# Patient Record
Sex: Female | Born: 2000 | Race: White | Hispanic: No | Marital: Single | Smoking: Never smoker
Health system: Southern US, Community
[De-identification: ages and names within clinical notes are randomized; demographics above are authoritative.]

## PROBLEM LIST (undated history)

## (undated) DIAGNOSIS — R51 Headache: Secondary | ICD-10-CM

## (undated) DIAGNOSIS — R519 Headache, unspecified: Secondary | ICD-10-CM

## (undated) DIAGNOSIS — E669 Obesity, unspecified: Secondary | ICD-10-CM

## (undated) DIAGNOSIS — T7840XA Allergy, unspecified, initial encounter: Secondary | ICD-10-CM

---
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
## 2018-10-12 IMAGING — CR DG SHOULDER 2+V*L*
3 series · 3 of 3 positions shown · non-contrast
Comparison: None.

CLINICAL DATA: Left shoulder pain following fall, initial encounter

EXAM:
LEFT SHOULDER - 2+ VIEW

[shoulder grashey]
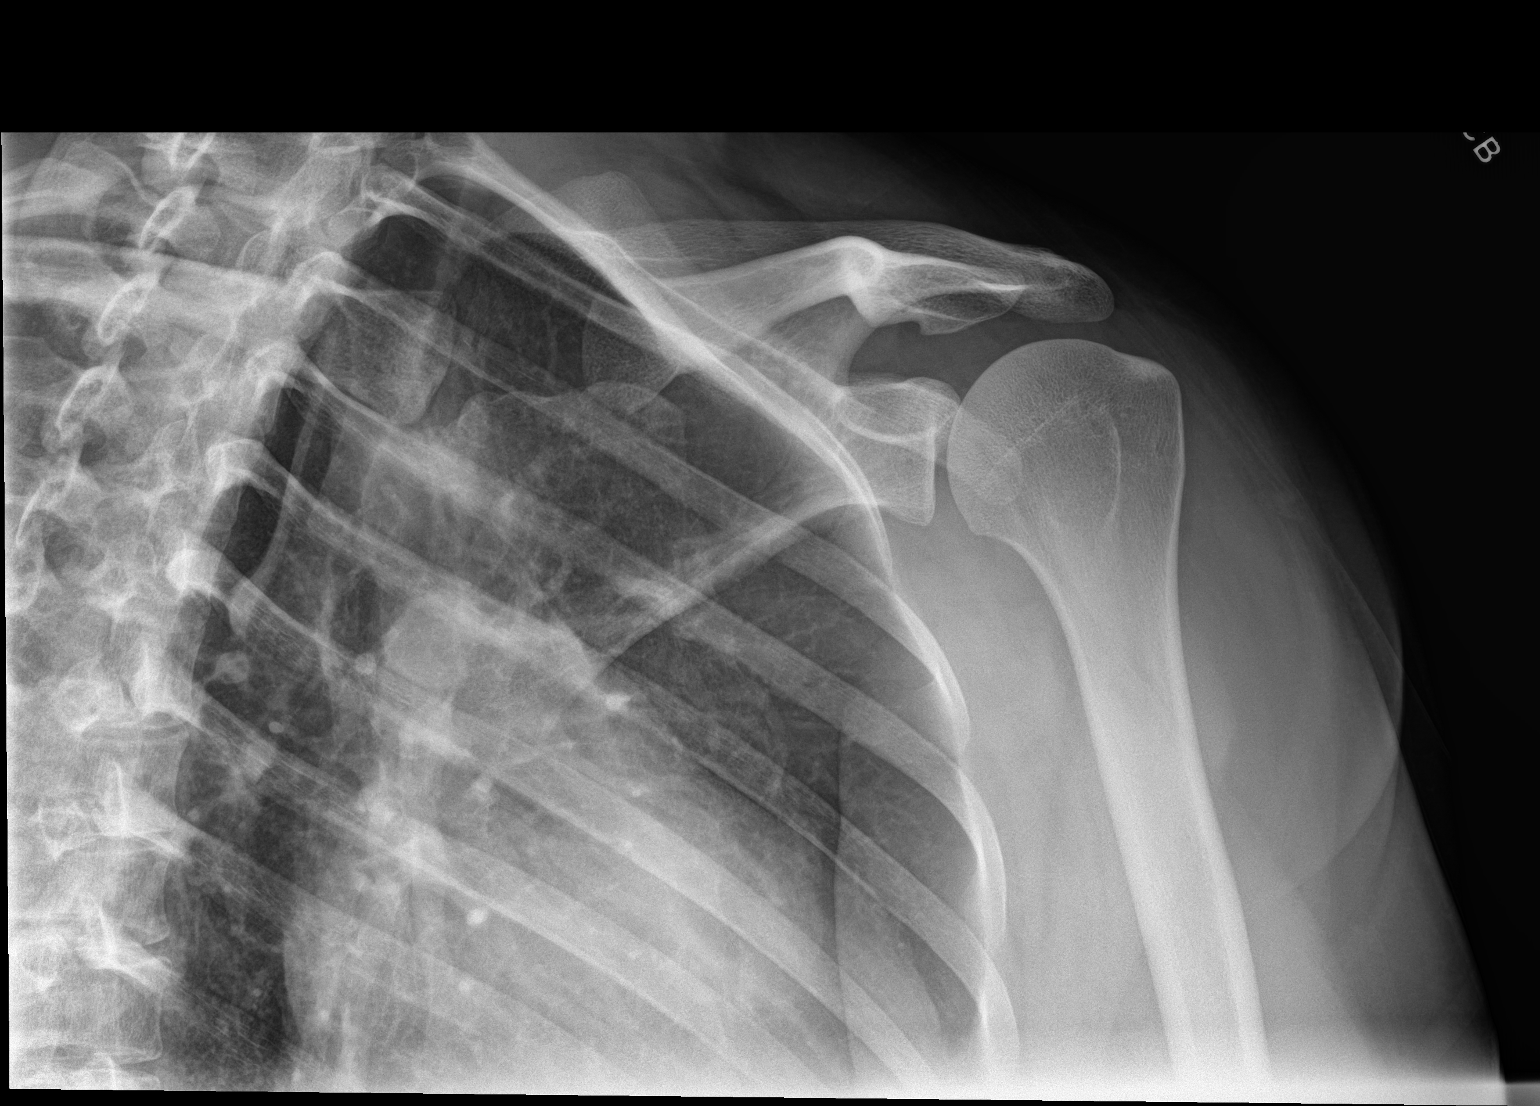

[shoulder y view]
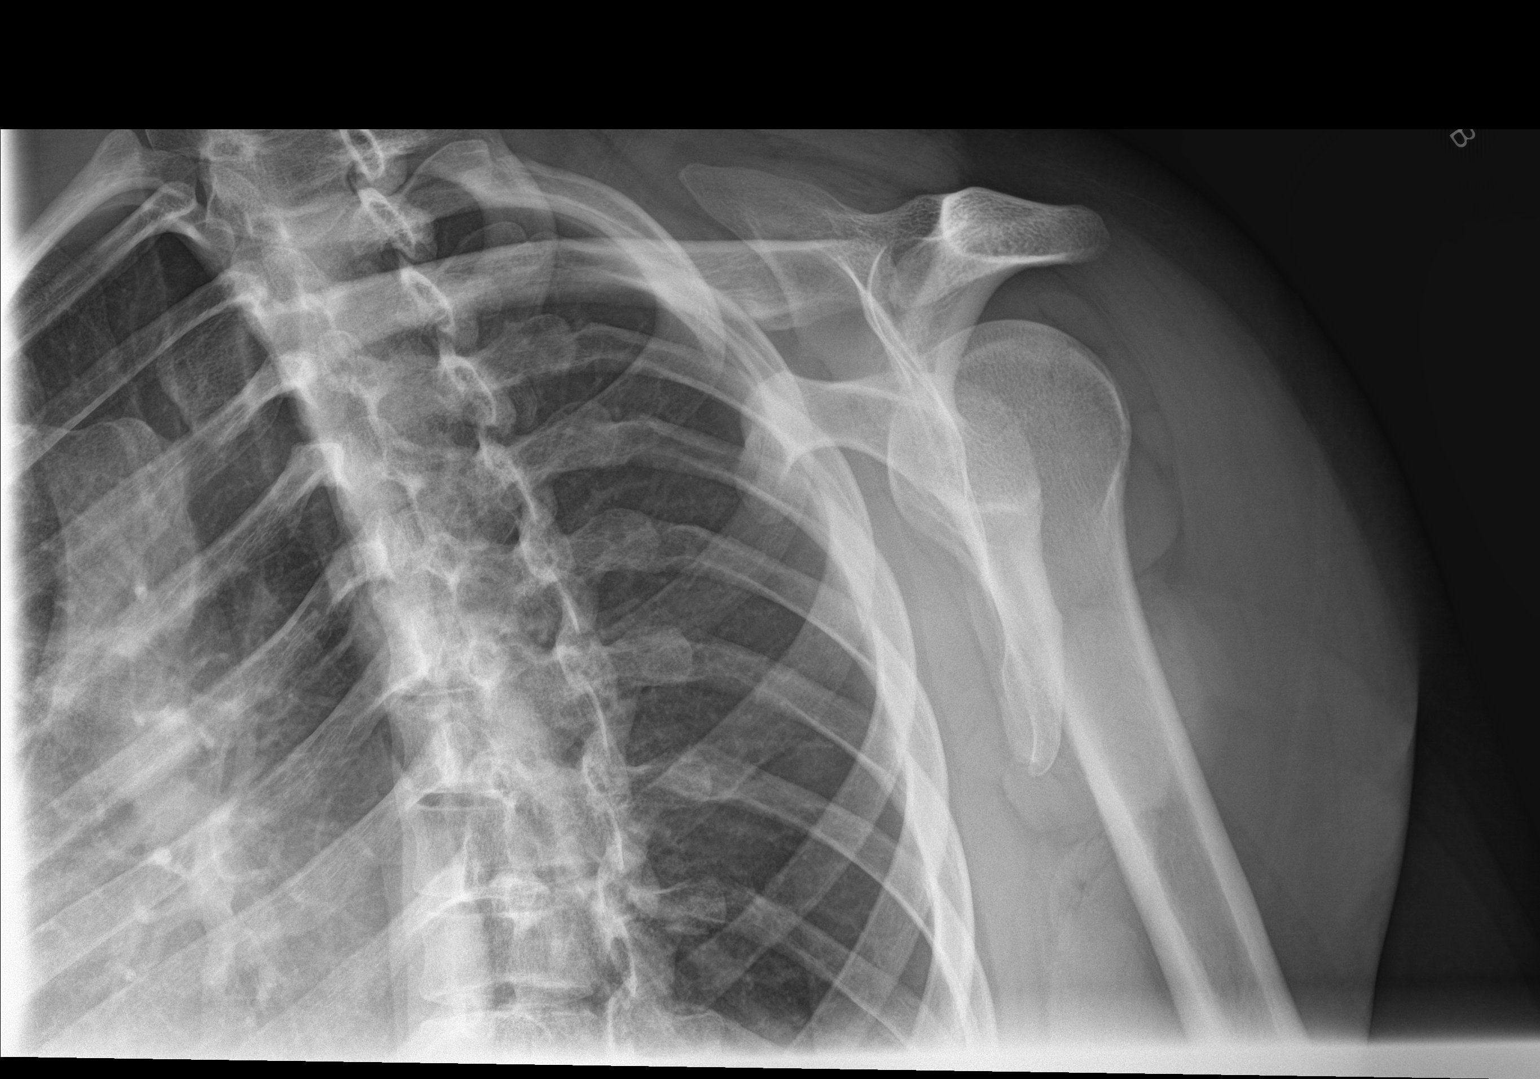

[shoulder axial]
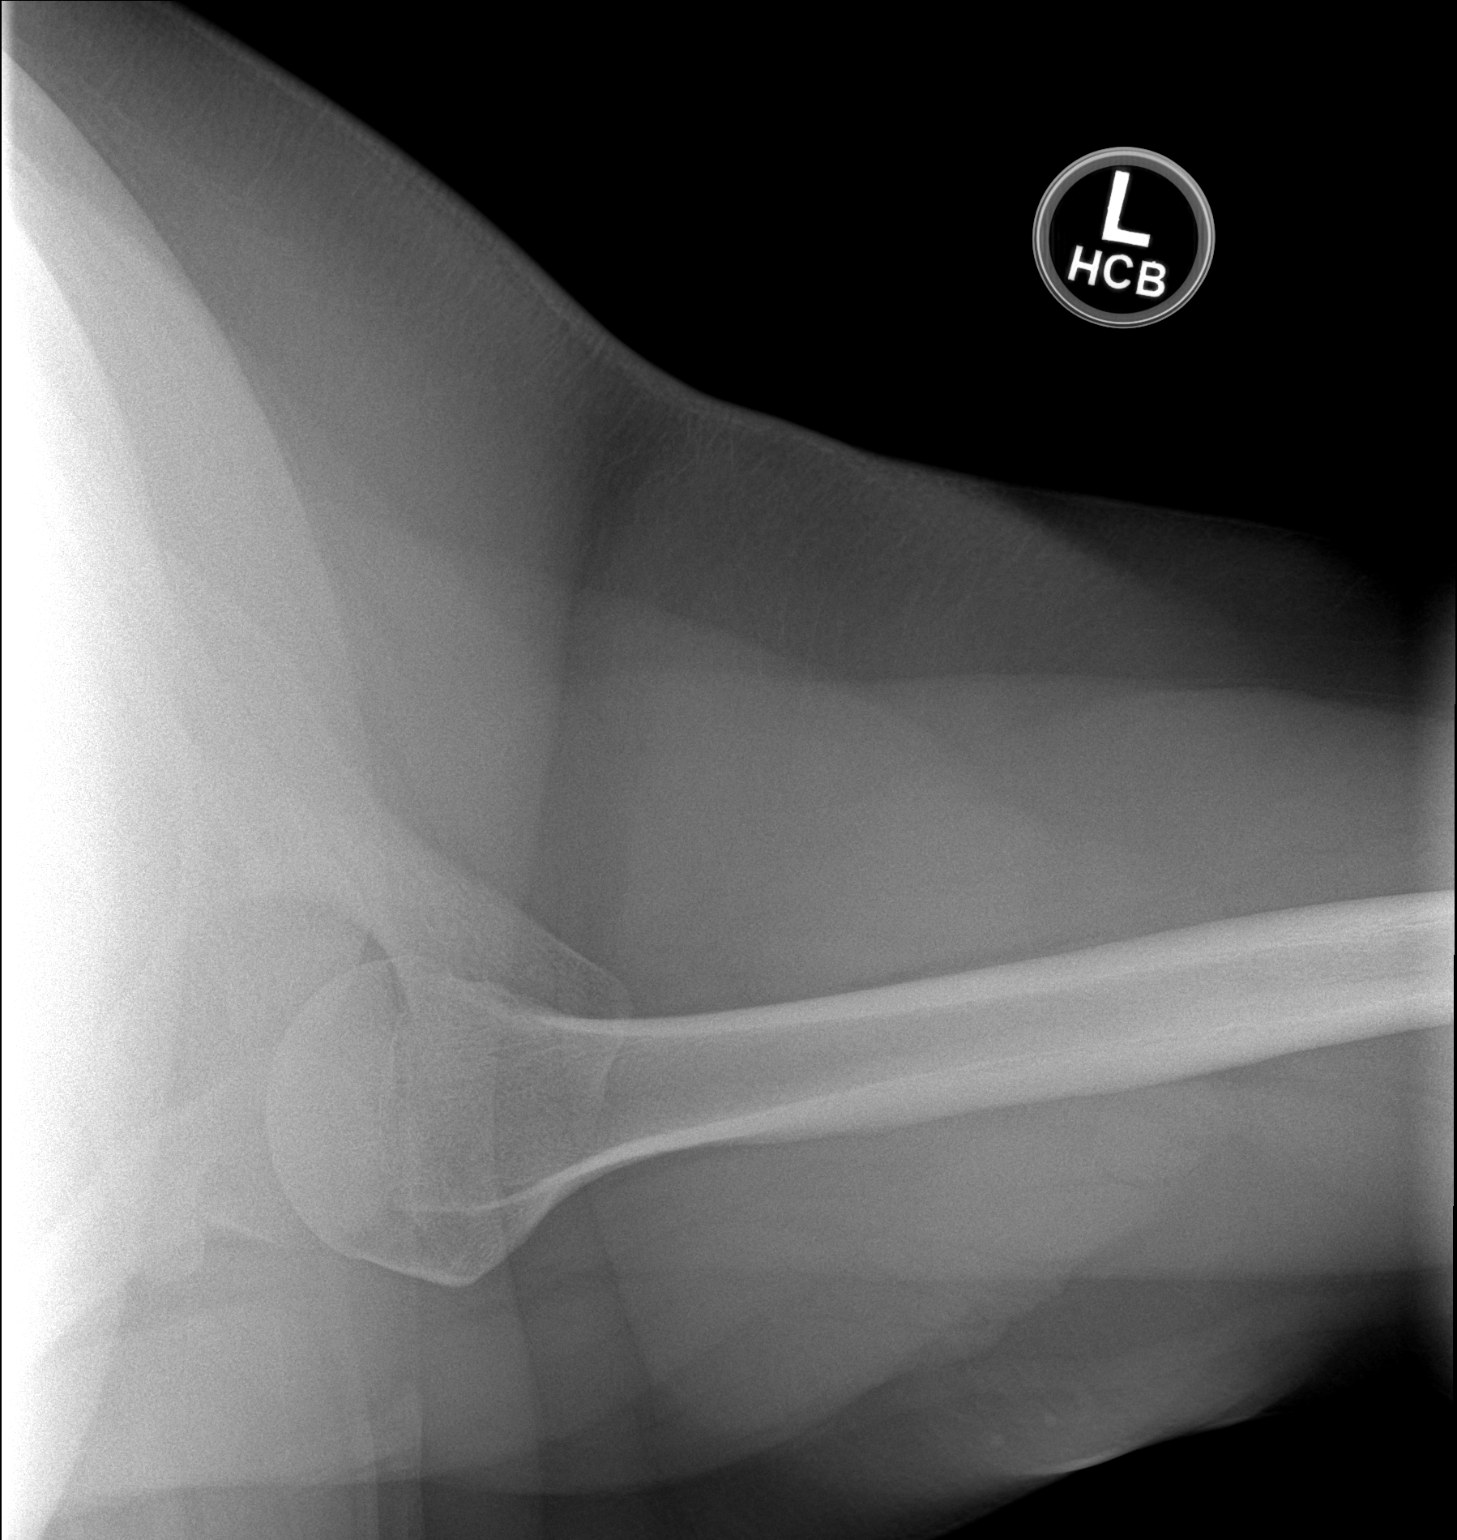

[3 of 3 positions shown; findings below may reference images not displayed]

FINDINGS: There is no evidence of fracture or dislocation. There is no
evidence of arthropathy or other focal bone abnormality. Soft
tissues are unremarkable.
IMPRESSION: No acute abnormality noted.

## 2019-01-18 ENCOUNTER — Other Ambulatory Visit: Payer: Self-pay

## 2019-01-18 ENCOUNTER — Ambulatory Visit
Admission: EM | Admit: 2019-01-18 | Discharge: 2019-01-18 | Disposition: A | Discharge status: Home / Self Care | Payer: Medicaid Other | Attending: Family Medicine | Admitting: Family Medicine

## 2019-01-18 ENCOUNTER — Encounter: Payer: Self-pay | Admitting: Emergency Medicine

## 2019-01-18 DIAGNOSIS — J111 Influenza due to unidentified influenza virus with other respiratory manifestations: Secondary | ICD-10-CM

## 2019-01-18 DIAGNOSIS — R69 Illness, unspecified: Secondary | ICD-10-CM | POA: Insufficient documentation

## 2019-01-18 DIAGNOSIS — J020 Streptococcal pharyngitis: Secondary | ICD-10-CM | POA: Insufficient documentation

## 2019-01-18 DIAGNOSIS — R0981 Nasal congestion: Secondary | ICD-10-CM

## 2019-01-18 LAB — RAPID INFLUENZA A&B ANTIGENS: Influenza A (ARMC): NEGATIVE

## 2019-01-18 LAB — RAPID STREP SCREEN (MED CTR MEBANE ONLY): Streptococcus, Group A Screen (Direct): POSITIVE — AB

## 2019-01-18 LAB — RAPID INFLUENZA A&B ANTIGENS (ARMC ONLY): INFLUENZA B (ARMC): NEGATIVE

## 2019-01-18 MED ORDER — OSELTAMIVIR PHOSPHATE 75 MG PO CAPS: 75.0000 mg | ORAL_CAPSULE | Freq: Two times a day (BID) | ORAL | 0 refills | Status: DC

## 2019-01-18 MED ORDER — PENICILLIN V POTASSIUM 500 MG PO TABS: 500.0000 mg | ORAL_TABLET | Freq: Three times a day (TID) | ORAL | 0 refills | Status: DC

## 2019-01-18 NOTE — ED Provider Notes (Signed)
[facility]    CSN: [id number] Arrival date & time: 01/18/19  0804     History   Chief Complaint Chief Complaint  Patient presents with  . Sore Throat  . Fever  . Generalized Body Aches    HPI Amy Conley is a 18 y.o. female.   The history is provided by the patient.  Sore Throat   Fever  Associated symptoms: congestion   URI  Presenting symptoms: congestion and fever   Severity:  Moderate Onset quality:  Sudden Duration:  2 days Timing:  Constant Progression:  Worsening Chronicity:  New Relieved by:  None tried Ineffective treatments:  None tried Associated symptoms: no wheezing   Risk factors: sick contacts (flu)     Past Medical History:  Diagnosis Date  . Allergy    SEASONAL ALLERGIES-USES ALBUTEROL INHALER PRN  . Headache    MIGRAINES-TAKES PHENERGAN AND ZOFRAN  . Obesity     There are no active problems to display for this patient.   Past Surgical History:  Procedure Laterality Date  . TONSILLECTOMY AND ADENOIDECTOMY Bilateral 10/03/2016   Procedure: TONSILLECTOMY AND ADENOIDECTOMY;  Surgeon: Vernie Murders, MD;  Location: [facility] ORS;  Service: ENT;  Laterality: Bilateral;    OB History   No obstetric history on file.      Home Medications    Prior to Admission medications   Medication Sig Start Date End Date Taking? Authorizing Provider  etonogestrel (NEXPLANON) 68 MG IMPL implant 1 each by Subdermal route once.   Yes [provider]  norethindrone-ethinyl estradiol-iron (ESTROSTEP FE,TILIA FE,TRI-LEGEST FE) 1-20/1-30/1-35 MG-MCG tablet Take 1 tablet by mouth daily.    [provider]  oseltamivir (TAMIFLU) 75 MG capsule Take 1 capsule (75 mg total) by mouth 2 (two) times daily. 01/18/19   Payton Mccallum, MD  penicillin v potassium (VEETID) 500 MG tablet Take 1 tablet (500 mg total) by mouth 3 (three) times daily. 01/18/19   Payton Mccallum, MD    Family History Family History  Problem Relation Age of Onset  .  Healthy Mother   . Healthy Father     Social History Social History   Tobacco Use  . Smoking status: Never Smoker  . Smokeless tobacco: Never Used  Substance Use Topics  . Alcohol use: No  . Drug use: No     Allergies   Food and Sulfa antibiotics   Review of Systems Review of Systems  Constitutional: Positive for fever.  HENT: Positive for congestion.   Respiratory: Negative for wheezing.      Physical Exam Triage Vital Signs ED Triage Vitals  Enc Vitals Group     BP 01/18/19 0820 (!) 137/91     Pulse Rate 01/18/19 0820 (!) 104     Resp 01/18/19 0820 16     Temp 01/18/19 0820 98.2 F (36.8 C)     Temp Source 01/18/19 0820 Oral     SpO2 01/18/19 0820 99 %     Weight 01/18/19 0816 290 lb (131.5 kg)     Height 01/18/19 0816 5\' 7"  (1.702 m)     Head Circumference --      Peak Flow --      Pain Score 01/18/19 0816 5     Pain Loc --      Pain Edu? --      Excl. in GC? --    No data found.  Updated Vital Signs BP (!) 137/91 (BP Location: Left Arm)   Pulse (!) 104  Temp 98.2 F (36.8 C) (Oral)   Resp 16   Ht 5\' 7"  (1.702 m)   Wt 131.5 kg   SpO2 99%   BMI 45.42 kg/m   Visual Acuity Right Eye Distance:   Left Eye Distance:   Bilateral Distance:    Right Eye Near:   Left Eye Near:    Bilateral Near:     Physical Exam Vitals signs and nursing note reviewed.  Constitutional:      General: She is not in acute distress.    Appearance: She is well-developed. She is not toxic-appearing or diaphoretic.  HENT:     Head: Normocephalic and atraumatic.     Right Ear: Tympanic membrane, ear canal and external ear normal.     Left Ear: Tympanic membrane, ear canal and external ear normal.     Nose: Congestion present.     Mouth/Throat:     Pharynx: Uvula midline. No oropharyngeal exudate.  Eyes:     General: No scleral icterus.       Right eye: No discharge.        Left eye: No discharge.  Neck:     Musculoskeletal: Normal range of motion and neck  supple.     Thyroid: No thyromegaly.  Cardiovascular:     Rate and Rhythm: Regular rhythm. Tachycardia present.     Heart sounds: Normal heart sounds.  Pulmonary:     Effort: Pulmonary effort is normal. No respiratory distress.     Breath sounds: Normal breath sounds. No stridor. No wheezing, rhonchi or rales.  Lymphadenopathy:     Cervical: No cervical adenopathy.  Neurological:     Mental Status: She is alert.      UC Treatments / Results  Labs (all labs ordered are listed, but only abnormal results are displayed) Labs Reviewed  RAPID STREP SCREEN ([facility] ONLY) - Abnormal; Notable for the following components:      Result Value   Streptococcus, Group A Screen (Direct) POSITIVE (*)    All other components within normal limits  RAPID INFLUENZA A&B ANTIGENS ([facility] ONLY)    EKG None  Radiology No results found.  Procedures Procedures (including critical care time)  Medications Ordered in UC Medications - No data to display  Initial Impression / Assessment and Plan / UC Course  I have reviewed the triage vital signs and the nursing notes.  Pertinent labs & imaging results that were available during my care of the patient were reviewed by me and considered in my medical decision making (see chart for details).      Final Clinical Impressions(s) / UC Diagnoses   Final diagnoses:  Influenza-like illness  Strep pharyngitis    ED Prescriptions    Medication Sig Dispense Auth. Provider   oseltamivir (TAMIFLU) 75 MG capsule Take 1 capsule (75 mg total) by mouth 2 (two) times daily. 10 capsule Payton Mccallum, MD   penicillin v potassium (VEETID) 500 MG tablet Take 1 tablet (500 mg total) by mouth 3 (three) times daily. 30 tablet Payton Mccallum, MD     1. Lab results and diagnosis reviewed with patient 2. rx as per orders above; reviewed possible side effects, interactions, risks and benefits  3. Recommend supportive treatment with rest, fluids, otc meds  prn 4. Follow-up prn if symptoms worsen or don't improve   Controlled Substance Prescriptions [place] Controlled Substance Registry consulted? Not Applicable   Payton Mccallum, MD 01/18/19 [phone]

## 2019-01-18 NOTE — Discharge Instructions (Addendum)
Rest, tylenol and/or advil

## 2019-01-18 NOTE — ED Triage Notes (Signed)
Patient c/o sore throat, fever, and bodyaches that started yesterday.

## 2019-08-16 ENCOUNTER — Other Ambulatory Visit: Payer: Self-pay

## 2019-08-16 DIAGNOSIS — Z20822 Contact with and (suspected) exposure to covid-19: Secondary | ICD-10-CM

## 2019-08-17 ENCOUNTER — Telehealth: Payer: Self-pay

## 2019-08-17 LAB — NOVEL CORONAVIRUS, NAA: SARS-CoV-2, NAA: NOT DETECTED

## 2019-08-17 NOTE — Telephone Encounter (Signed)
Patient calling to ask if her covid results are back yet, tested on yesterday. I advised no results and to keep checking on her MyChart for results, she verbalized understanding.

## 2019-09-17 ENCOUNTER — Telehealth: Payer: Self-pay | Admitting: Family Medicine

## 2019-09-17 NOTE — Telephone Encounter (Signed)
TC with patient. Patient states has had irregular spotting since Nexplanon insertion; explained to patient this is the most common side effect. States that even before the Nexplanon she had irregular periods. RN reviewed patient chart, has never been screened for STDs and is sexually active.  Co patient STDs/BV can cause BTB and offered appt to see provider for screening and eval. 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 Instructed to call [facility] back if would like appt Aileen Fass, RN

## 2019-09-17 NOTE — Telephone Encounter (Signed)
Has questions about Nexplanon. Patient has had it for a year.

## 2019-09-19 ENCOUNTER — Other Ambulatory Visit: Payer: Self-pay | Admitting: *Deleted

## 2019-09-19 DIAGNOSIS — Z20822 Contact with and (suspected) exposure to covid-19: Secondary | ICD-10-CM

## 2019-09-21 LAB — NOVEL CORONAVIRUS, NAA: SARS-CoV-2, NAA: NOT DETECTED

## 2020-02-22 ENCOUNTER — Ambulatory Visit: Payer: Medicaid Other | Attending: Internal Medicine

## 2020-02-22 DIAGNOSIS — Z23 Encounter for immunization: Secondary | ICD-10-CM

## 2020-02-22 NOTE — Progress Notes (Signed)
   Covid-19 Vaccination Clinic  Name:  ROXENE ALVIAR    MRN: [id number] DOB: 02/09/2001  02/22/2020  Ms. Gaultney was observed post Covid-19 immunization for 15 minutes without incident. She was provided with Vaccine Information Sheet and instruction to access the V-Safe system.   Ms. Beirne was instructed to call 911 with any severe reactions post vaccine: Marland Kitchen Difficulty breathing  . Swelling of face and throat  . A fast heartbeat  . A bad rash all over body  . Dizziness and weakness   Immunizations Administered    Name Date Dose VIS Date Route   Pfizer COVID-19 Vaccine 02/22/2020 12:56 PM 0.3 mL 10/25/2019 Intramuscular   Manufacturer: [employer], [employer]   Lot: [id number]   NDC: [address]-4

## 2020-03-18 ENCOUNTER — Ambulatory Visit: Payer: Medicaid Other | Attending: Internal Medicine

## 2020-03-18 DIAGNOSIS — Z23 Encounter for immunization: Secondary | ICD-10-CM

## 2020-03-18 NOTE — Progress Notes (Signed)
   Covid-19 Vaccination Clinic  Name:  Amy Conley    MRN: [id number] DOB: March 14, 2001  03/18/2020  [name] was observed post Covid-19 immunization for 15 minutes without incident. She was provided with Vaccine Information Sheet and instruction to access the V-Safe system.   [name] was instructed to call 911 with any severe reactions post vaccine: Marland Kitchen Difficulty breathing  . Swelling of face and throat  . A fast heartbeat  . A bad rash all over body  . Dizziness and weakness   Immunizations Administered    Name Date Dose VIS Date Route   Pfizer COVID-19 Vaccine 03/18/2020  3:56 PM 0.3 mL 01/08/2019 Intramuscular   Manufacturer: [employer], [employer]   Lot: [id number]   NDC: [address]-1

## 2020-03-27 ENCOUNTER — Ambulatory Visit: Payer: BC Managed Care – PPO | Admitting: Podiatry

## 2020-03-27 ENCOUNTER — Other Ambulatory Visit: Payer: Self-pay

## 2020-03-27 DIAGNOSIS — L600 Ingrowing nail: Secondary | ICD-10-CM

## 2020-03-27 MED ORDER — GENTAMICIN SULFATE 0.1 % EX CREA: 1.0000 "application " | TOPICAL_CREAM | Freq: Two times a day (BID) | CUTANEOUS | 1 refills | Status: DC

## 2020-03-29 NOTE — Progress Notes (Signed)
   Subjective: Patient presents today for evaluation of burning, stabbing pain to the lateral border of the right hallux that began over three years. Patient is concerned for possible ingrown nail. Applying pressure to the nail increases the pain. She has been soaking the toe in Epsom salt for treatment. Patient presents today for further treatment and evaluation.  Past Medical History:  Diagnosis Date  . Allergy    SEASONAL ALLERGIES-USES ALBUTEROL INHALER PRN  . Headache    MIGRAINES-TAKES PHENERGAN AND ZOFRAN  . Obesity     Objective:  General: Well developed, nourished, in no acute distress, alert and oriented x3   Dermatology: Skin is warm, dry and supple bilateral. Lateral border of the right great toe appears to be erythematous with evidence of an ingrowing nail. Pain on palpation noted to the border of the nail fold. The remaining nails appear unremarkable at this time. There are no open sores, lesions.  Vascular: Dorsalis Pedis artery and Posterior Tibial artery pedal pulses palpable. No lower extremity edema noted.   Neruologic: Grossly intact via light touch bilateral.  Musculoskeletal: Muscular strength within normal limits in all groups bilateral. Normal range of motion noted to all pedal and ankle joints.   Assesement: #1 Paronychia with ingrowing nail lateral border right great toe #2 Pain in toe #3 Incurvated nail  Plan of Care:  1. Patient evaluated.  2. Discussed treatment alternatives and plan of care. Explained nail avulsion procedure and post procedure course to patient. 3. Patient opted for permanent partial nail avulsion of the lateral border right great toe.  4. Prior to procedure, local anesthesia infiltration utilized using 3 ml of a 50:50 mixture of 2% plain lidocaine and 0.5% plain marcaine in a normal hallux block fashion and a betadine prep performed.  5. Partial permanent nail avulsion with chemical matrixectomy performed using 3x30sec applications of  phenol followed by alcohol flush.  6. Light dressing applied. 7. Prescription for Gentamicin cream provided to patient to use daily with a bandage.  8. Return to clinic in 2 weeks.  Felecia Shelling, DPM [facility]  Dr. Felecia Shelling, DPM    [address]                                        [place], [place] [address]                Office [phone]  Fax [phone]

## 2020-03-30 ENCOUNTER — Telehealth: Payer: Self-pay | Admitting: *Deleted

## 2020-03-30 NOTE — Telephone Encounter (Signed)
"  I had surgery on my toenail on Friday.  Is my toe still supposed to be bleeding.  I've used three bandaids today already.  There's a pocket that is filled with blood."  Have you been soaking it?  "Yes, I soak it twice a day and put the antibiotic ointment on it like he said.  It's disgusting.  I went to the school nurse this morning to get another bandaid."  I'll send a message to Dr. Logan Bores and see what he recommends.

## 2020-03-31 NOTE — Telephone Encounter (Signed)
Continue post nail avulsion protocol.

## 2020-04-10 ENCOUNTER — Other Ambulatory Visit: Payer: Self-pay

## 2020-04-10 ENCOUNTER — Ambulatory Visit: Payer: BC Managed Care – PPO | Admitting: Podiatry

## 2020-04-10 ENCOUNTER — Encounter: Payer: Self-pay | Admitting: Podiatry

## 2020-04-10 DIAGNOSIS — L600 Ingrowing nail: Secondary | ICD-10-CM | POA: Diagnosis not present

## 2020-04-10 NOTE — Progress Notes (Signed)
   Subjective: 19 y.o. female presents today status post permanent nail avulsion procedure of the lateral border of the right hallux that was performed on 03/27/2020. She states she is doing well and improving. She reports some mild intermittent bleeding from the area. She has been soaking the toe and applying Gentamicin cream as directed. Patient is here for further evaluation and treatment.    Past Medical History:  Diagnosis Date  . Allergy    SEASONAL ALLERGIES-USES ALBUTEROL INHALER PRN  . Headache    MIGRAINES-TAKES PHENERGAN AND ZOFRAN  . Obesity     Objective: Skin is warm, dry and supple. Nail and respective nail fold appears to be healing appropriately. Open wound to the associated nail fold with a granular wound base and moderate amount of fibrotic tissue. Minimal drainage noted. Mild erythema around the periungual region likely due to phenol chemical matricectomy.  Assessment: #1 postop permanent partial nail avulsion lateral border right hallux  #2 open wound periungual nail fold of respective digit.   Plan of care: #1 patient was evaluated  #2 debridement of open wound was performed to the periungual border of the respective toe using a currette. Antibiotic ointment and Band-Aid was applied. #3 patient is to return to clinic on a PRN basis.   Felecia Shelling, DPM [facility]  Dr. Felecia Shelling, DPM    [address]                                        [place], [place] [address]                Office [phone]  Fax [phone]

## 2020-08-21 ENCOUNTER — Other Ambulatory Visit: Payer: Self-pay | Admitting: Physician Assistant

## 2020-08-21 DIAGNOSIS — H9209 Otalgia, unspecified ear: Secondary | ICD-10-CM

## 2020-08-28 ENCOUNTER — Ambulatory Visit: Payer: Medicaid Other

## 2021-03-07 ENCOUNTER — Encounter: Payer: Self-pay | Admitting: Emergency Medicine

## 2021-03-07 ENCOUNTER — Ambulatory Visit
Admission: EM | Admit: 2021-03-07 | Discharge: 2021-03-07 | Disposition: A | Discharge status: Home / Self Care | Payer: BC Managed Care – PPO | Attending: Emergency Medicine | Admitting: Emergency Medicine

## 2021-03-07 ENCOUNTER — Other Ambulatory Visit: Payer: Self-pay

## 2021-03-07 DIAGNOSIS — Z20822 Contact with and (suspected) exposure to covid-19: Secondary | ICD-10-CM | POA: Diagnosis not present

## 2021-03-07 DIAGNOSIS — H66002 Acute suppurative otitis media without spontaneous rupture of ear drum, left ear: Secondary | ICD-10-CM | POA: Insufficient documentation

## 2021-03-07 DIAGNOSIS — Z882 Allergy status to sulfonamides status: Secondary | ICD-10-CM | POA: Insufficient documentation

## 2021-03-07 DIAGNOSIS — R059 Cough, unspecified: Secondary | ICD-10-CM | POA: Diagnosis present

## 2021-03-07 DIAGNOSIS — J069 Acute upper respiratory infection, unspecified: Secondary | ICD-10-CM | POA: Insufficient documentation

## 2021-03-07 DIAGNOSIS — J029 Acute pharyngitis, unspecified: Secondary | ICD-10-CM | POA: Diagnosis not present

## 2021-03-07 LAB — GROUP A STREP BY PCR: Group A Strep by PCR: NOT DETECTED

## 2021-03-07 MED ORDER — BENZONATATE 100 MG PO CAPS: 200.0000 mg | ORAL_CAPSULE | Freq: Three times a day (TID) | ORAL | 0 refills | Status: DC

## 2021-03-07 MED ORDER — PROMETHAZINE-DM 6.25-15 MG/5ML PO SYRP: 5.0000 mL | ORAL_SOLUTION | Freq: Four times a day (QID) | ORAL | 0 refills | Status: DC | PRN

## 2021-03-07 MED ORDER — AMOXICILLIN-POT CLAVULANATE 875-125 MG PO TABS: 1.0000 | ORAL_TABLET | Freq: Two times a day (BID) | ORAL | 0 refills | Status: AC

## 2021-03-07 MED ORDER — IPRATROPIUM BROMIDE 0.06 % NA SOLN: 2.0000 | Freq: Four times a day (QID) | NASAL | 12 refills | Status: DC

## 2021-03-07 NOTE — ED Triage Notes (Signed)
Patient c/o cough sore throat, and runny nose and sinus congestion that started on Friday.  Patient denies fevers. Patient took 2 covid tests at home both negative.

## 2021-03-07 NOTE — Discharge Instructions (Addendum)
The Augmentin twice daily with food for 10 days for treatment of your ear infection  Perform sinus irrigation 2-3 times a day with a [employer]Med sinus rinse kit and distilled water.  Do not use tap water.  Use the ipratropium nasal spray, 2 squirts up each nostril every 6 hours, as needed for nasal congestion, runny nose, and postnasal drip.  Use the Tessalon Perles for the days needed for cough and the Promethazine DM cough syrup at bedtime.  You can use plain over-the-counter Mucinex every 6 hours to break up the stickiness of the mucus so your body can clear it.  Increase your oral fluid intake to thin out your mucus so that is also able for your body to clear more easily.  Take an over-the-counter probiotic, such as Culturelle-align-activia, 1 hour after each dose of antibiotic to prevent diarrhea.  If you develop any new or worsening symptoms return for reevaluation or see your primary care provider.   Isolate at home pending the results of your COVID test.  If you test positive you will have to quarantine for 5 days from the onset of your symptoms.  After 5 days you can break quarantine if your symptoms have improved and you have not had a fever for 24 hours without taking Tylenol or ibuprofen.

## 2021-03-07 NOTE — ED Provider Notes (Signed)
[facility]    CSN: [id number] Arrival date & time: 03/07/21  1202      History   Chief Complaint Chief Complaint  Patient presents with  . Cough    HPI Amy Conley is a 20 y.o. female.   HPI   Female here for evaluation of respiratory complaints.  Patient reports that she has been experiencing a runny nose for clear nasal discharge, left ear pain, sore throat, and a productive cough for clear to yellow sputum for the last 2 days.  Patient denies fever, shortness of breath or wheezing, or GI complaints.  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  She has been vaccinated but not boosted.  Patient is taken to home COVID test that were negative.  Past Medical History:  Diagnosis Date  . Allergy    SEASONAL ALLERGIES-USES ALBUTEROL INHALER PRN  . Headache    MIGRAINES-TAKES PHENERGAN AND ZOFRAN  . Obesity     Patient Active Problem List   Diagnosis Date Noted  . Urticaria 10/16/2015    Past Surgical History:  Procedure Laterality Date  . TONSILLECTOMY AND ADENOIDECTOMY Bilateral 10/03/2016   Procedure: TONSILLECTOMY AND ADENOIDECTOMY;  Surgeon: Margaretha Sheffield, MD;  Location: [facility];  Service: ENT;  Laterality: Bilateral;    OB History   No obstetric history on file.      Home Medications    Prior to Admission medications   Medication Sig Start Date End Date Taking? Authorizing Provider  amoxicillin-clavulanate (AUGMENTIN) 875-125 MG tablet Take 1 tablet by mouth every 12 (twelve) hours for 10 days. 03/07/21 03/17/21 Yes Margarette Canada, NP  benzonatate (TESSALON) 100 MG capsule Take 2 capsules (200 mg total) by mouth every 8 (eight) hours. 03/07/21  Yes Margarette Canada, NP  cetirizine (ZYRTEC) 10 MG tablet Take by mouth.   Yes [provider]  ipratropium (ATROVENT) 0.06 % nasal spray Place 2 sprays into both nostrils 4 (four) times daily. 03/07/21  Yes Margarette Canada, NP  norethindrone-ethinyl  estradiol-iron (ESTROSTEP FE,TILIA FE,TRI-LEGEST FE) 1-20/1-30/1-35 MG-MCG tablet Take 1 tablet by mouth daily.   Yes [provider]  promethazine-dextromethorphan (PROMETHAZINE-DM) 6.25-15 MG/5ML syrup Take 5 mLs by mouth 4 (four) times daily as needed. 03/07/21  Yes Margarette Canada, NP  acetaminophen (TYLENOL) 325 MG tablet Take by mouth.    [provider]  ALLERGY RELIEF 180 MG tablet Take 180 mg by mouth daily. 12/20/19   [provider]  APRI 0.15-30 MG-MCG tablet Take 1 tablet by mouth daily. 03/13/20   [provider]  diphenhydrAMINE (BENADRYL) 25 mg capsule Take by mouth.    [provider]  EPINEPHrine 0.3 mg/0.3 mL IJ SOAJ injection 0.3 mg as needed for anaphylaxis.    [provider]  etonogestrel (NEXPLANON) 68 MG IMPL implant 1 each by Subdermal route once.  03/07/21  [provider]    Family History Family History  Problem Relation Age of Onset  . Healthy Mother   . Healthy Father     Social History Social History   Tobacco Use  . Smoking status: Never Smoker  . Smokeless tobacco: Never Used  Vaping Use  . Vaping Use: Never used  Substance Use Topics  . Alcohol use: No  . Drug use: No     Allergies   Food and Sulfa antibiotics   Review of Systems Review of Systems  Constitutional: Negative for activity change, appetite change and fever.  HENT: Positive for congestion, ear pain, rhinorrhea, sore throat and tinnitus.   Respiratory: Positive for cough. Negative for shortness of breath and wheezing.   Gastrointestinal: Negative for abdominal pain, constipation, diarrhea, nausea and vomiting.  Musculoskeletal: Negative for arthralgias and myalgias.  Skin: Negative for rash.  Neurological: Negative for headaches.  Hematological: Negative.   Psychiatric/Behavioral: Negative.      Physical Exam Triage Vital Signs ED Triage Vitals  Enc Vitals Group     BP 03/07/21 1244 (!) 138/99     Pulse Rate  03/07/21 1244 (!) 108     Resp 03/07/21 1244 16     Temp 03/07/21 1244 98.6 F (37 C)     Temp Source 03/07/21 1244 Oral     SpO2 03/07/21 1244 99 %     Weight 03/07/21 1240 300 lb (136.1 kg)     Height 03/07/21 1240 5' 7"  (1.702 m)     Head Circumference --      Peak Flow --      Pain Score 03/07/21 1239 7     Pain Loc --      Pain Edu? --      Excl. in [place]? --    No data found.  Updated Vital Signs BP (!) 138/99 (BP Location: Left Arm)   Pulse (!) 108   Temp 98.6 F (37 C) (Oral)   Resp 16   Ht 5' 7"  (1.702 m)   Wt 300 lb (136.1 kg)   LMP 02/14/2021 (Approximate)   SpO2 99%   BMI 46.99 kg/m   Visual Acuity Right Eye Distance:   Left Eye Distance:   Bilateral Distance:    Right Eye Near:   Left Eye Near:    Bilateral Near:     Physical Exam Vitals and nursing note reviewed.  Constitutional:      General: She is not in acute distress.    Appearance: Normal appearance. She is not ill-appearing.  HENT:     Head: Normocephalic and atraumatic.     Right Ear: Tympanic membrane, ear canal and external ear normal. There is no impacted cerumen.     Left Ear: Ear canal and external ear normal. There is no impacted cerumen.     Nose: Congestion and rhinorrhea present.     Mouth/Throat:     Mouth: Mucous membranes are moist.     Pharynx: Oropharynx is clear. Posterior oropharyngeal erythema present.  Cardiovascular:     Rate and Rhythm: Normal rate and regular rhythm.     Pulses: Normal pulses.     Heart sounds: Normal heart sounds. No murmur heard. No gallop.   Pulmonary:     Effort: Pulmonary effort is normal.     Breath sounds: Normal breath sounds. No wheezing, rhonchi or rales.  Musculoskeletal:     Cervical back: Normal range of motion and neck supple.  Lymphadenopathy:     Cervical: No cervical adenopathy.  Skin:    General: Skin is warm and dry.     Capillary Refill: Capillary refill takes less than 2 seconds.     Findings: No erythema or rash.   Neurological:     General: No focal deficit present.     Mental Status: She is alert and oriented to person, place, and time.  Psychiatric:        Mood and Affect: Mood normal.        Behavior: Behavior normal.        Thought Content: Thought content normal.  Judgment: Judgment normal.      UC Treatments / Results  Labs (all labs ordered are listed, but only abnormal results are displayed) Labs Reviewed  GROUP A STREP BY PCR  SARS CORONAVIRUS 2 (TAT 6-24 HRS)    EKG   Radiology No results found.  Procedures Procedures (including critical care time)  Medications Ordered in UC Medications - No data to display  Initial Impression / Assessment and Plan / UC Course  I have reviewed the triage vital signs and the nursing notes.  Pertinent labs & imaging results that were available during my care of the patient were reviewed by me and considered in my medical decision making (see chart for details).   Patient is a very pleasant 20 year old female here for evaluation of respiratory complaints that been going on for last 2 days and include a runny nose with clear nasal discharge, sore throat, left ear pain with ringing in her ear, productive cough for a yellow to clear sputum.  She has not had a fever, shortness of breath, wheezing, or GI complaints.  Patient has been exposed to [place] in her [place] but she has taken 2 home COVID test that are negative.  Patient has had her vaccine but not her booster.  Patient's physical exam reveals an erythematous and injected left tympanic membrane with a loss of landmarks.  External auditory canal is clear.  Right tympanic membrane is pearly gray with a normal light reflex and clear external auditory canal.  Nasal mucosa is mildly erythematous and edematous with scant clear nasal discharge.  Posterior oropharynx has mild erythema with clear postnasal drip.  Tonsillar pillars are surgically absent.  No cervical lymphadenopathy  on exam.  Lungs are clear to auscultation all fields.  We will treat patient for left otitis media with Augmentin twice daily for 10 days, provide Tessalon Perles, Promethazine DM for cough and congestion, and provide ipratropium nasal spray for runny nose.  Patient will be given work-up to cover her for waiting period for her COVID test which be back tomorrow.  Patient advised that if she is COVID-positive her work note will be extended to cover her for the full quarantine period.   Final Clinical Impressions(s) / UC Diagnoses   Final diagnoses:  Viral URI with cough  Non-recurrent acute suppurative otitis media of left ear without spontaneous rupture of tympanic membrane     Discharge Instructions     The Augmentin twice daily with food for 10 days for treatment of your ear infection  Perform sinus irrigation 2-3 times a day with a [employer] sinus rinse kit and distilled water.  Do not use tap water.  Use the ipratropium nasal spray, 2 squirts up each nostril every 6 hours, as needed for nasal congestion, runny nose, and postnasal drip.  Use the Tessalon Perles for the days needed for cough and the Promethazine DM cough syrup at bedtime.  You can use plain over-the-counter Mucinex every 6 hours to break up the stickiness of the mucus so your body can clear it.  Increase your oral fluid intake to thin out your mucus so that is also able for your body to clear more easily.  Take an over-the-counter probiotic, such as Culturelle-align-activia, 1 hour after each dose of antibiotic to prevent diarrhea.  If you develop any new or worsening symptoms return for reevaluation or see your primary care provider.   Isolate at home pending the results of your COVID test.  If you test positive  you will have to quarantine for 5 days from the onset of your symptoms.  After 5 days you can break quarantine if your symptoms have improved and you have not had a fever for 24 hours without taking Tylenol or  ibuprofen.    ED Prescriptions    Medication Sig Dispense Auth. Provider   amoxicillin-clavulanate (AUGMENTIN) 875-125 MG tablet Take 1 tablet by mouth every 12 (twelve) hours for 10 days. 20 tablet Margarette Canada, NP   ipratropium (ATROVENT) 0.06 % nasal spray Place 2 sprays into both nostrils 4 (four) times daily. 15 mL Margarette Canada, NP   benzonatate (TESSALON) 100 MG capsule Take 2 capsules (200 mg total) by mouth every 8 (eight) hours. 21 capsule Margarette Canada, NP   promethazine-dextromethorphan (PROMETHAZINE-DM) 6.25-15 MG/5ML syrup Take 5 mLs by mouth 4 (four) times daily as needed. 118 mL Margarette Canada, NP     PDMP not reviewed this encounter.   Margarette Canada, NP 03/07/21 1347

## 2021-03-08 LAB — SARS CORONAVIRUS 2 (TAT 6-24 HRS): SARS Coronavirus 2: NEGATIVE

## 2021-07-20 ENCOUNTER — Ambulatory Visit
Admission: EM | Admit: 2021-07-20 | Discharge: 2021-07-20 | Disposition: A | Discharge status: Home / Self Care | Payer: BC Managed Care – PPO | Attending: Emergency Medicine | Admitting: Emergency Medicine

## 2021-07-20 ENCOUNTER — Other Ambulatory Visit: Payer: Self-pay

## 2021-07-20 DIAGNOSIS — R091 Pleurisy: Secondary | ICD-10-CM | POA: Diagnosis not present

## 2021-07-20 MED ORDER — PROMETHAZINE-DM 6.25-15 MG/5ML PO SYRP: 5.0000 mL | ORAL_SOLUTION | Freq: Four times a day (QID) | ORAL | 0 refills | Status: DC | PRN

## 2021-07-20 MED ORDER — BENZONATATE 100 MG PO CAPS: 200.0000 mg | ORAL_CAPSULE | Freq: Three times a day (TID) | ORAL | 0 refills | Status: DC

## 2021-07-20 MED ORDER — AEROCHAMBER MV MISC: 2 refills | Status: DC

## 2021-07-20 NOTE — ED Provider Notes (Signed)
[facility]    CSN: [id number] Arrival date & time: 07/20/21  1828      History   Chief Complaint Chief Complaint  Patient presents with   Cough    HPI Amy Conley is a 20 y.o. female.   HPI  20 year old female here for evaluation of cough.  Patient was evaluated 4 days ago at the urgent care for cough that was productive for green mucus and worse when she was laying down.  She also has some shortness of breath and wheezing for which she had been using albuterol without any help.  Patient was diagnosed with pneumonia and discharged on amoxicillin, azithromycin, prednisone, and albuterol inhaler.  She however was not prescribed a spacer.  She states that since then she has continued to cough and now she is having pain around the lower part of her rib cage that is worse when she coughs or takes a deep breath.  She is still having a productive cough for yellow mucus and intermittent wheezing.  She denies any shortness of breath.  She is also not had a fever.  Past Medical History:  Diagnosis Date   Allergy    SEASONAL ALLERGIES-USES ALBUTEROL INHALER PRN   Headache    MIGRAINES-TAKES PHENERGAN AND ZOFRAN   Obesity     Patient Active Problem List   Diagnosis Date Noted   Urticaria 10/16/2015    Past Surgical History:  Procedure Laterality Date   TONSILLECTOMY AND ADENOIDECTOMY Bilateral 10/03/2016   Procedure: TONSILLECTOMY AND ADENOIDECTOMY;  Surgeon: Vernie Murders, MD;  Location: [facility];  Service: ENT;  Laterality: Bilateral;    OB History   No obstetric history on file.      Home Medications    Prior to Admission medications   Medication Sig Start Date End Date Taking? Authorizing Provider  acetaminophen (TYLENOL) 325 MG tablet Take by mouth.   Yes [provider]  ALLERGY RELIEF 180 MG tablet Take 180 mg by mouth daily. 12/20/19  Yes [provider]  APRI 0.15-30 MG-MCG tablet Take 1 tablet by mouth daily. 03/13/20  Yes [provider]  cetirizine (ZYRTEC) 10 MG tablet Take by mouth.   Yes [provider]  diphenhydrAMINE (BENADRYL) 25 mg capsule Take by mouth.   Yes [provider]  EPINEPHrine 0.3 mg/0.3 mL IJ SOAJ injection 0.3 mg as needed for anaphylaxis.   Yes [provider]  ipratropium (ATROVENT) 0.06 % nasal spray Place 2 sprays into both nostrils 4 (four) times daily. 03/07/21  Yes Becky Augusta, NP  norethindrone-ethinyl estradiol-iron (ESTROSTEP FE,TILIA FE,TRI-LEGEST FE) 1-20/1-30/1-35 MG-MCG tablet Take 1 tablet by mouth daily.   Yes [provider]  Spacer/Aero-Holding Chambers (AEROCHAMBER MV) inhaler Use as instructed 07/20/21  Yes Becky Augusta, NP  benzonatate (TESSALON) 100 MG capsule Take 2 capsules (200 mg total) by mouth every 8 (eight) hours. 07/20/21   Becky Augusta, NP  promethazine-dextromethorphan (PROMETHAZINE-DM) 6.25-15 MG/5ML syrup Take 5 mLs by mouth 4 (four) times daily as needed. 07/20/21   Becky Augusta, NP  etonogestrel (NEXPLANON) 68 MG IMPL implant 1 each by Subdermal route once.  03/07/21  [provider]    Family History Family History  Problem Relation Age of Onset   Healthy Mother    Healthy Father     Social History Social History   Tobacco Use   Smoking status: Never   Smokeless tobacco: Never  Vaping Use   Vaping Use: Never used  Substance Use Topics  Alcohol use: No   Drug use: No     Allergies   Food and Sulfa antibiotics   Review of Systems Review of Systems  Constitutional:  Negative for activity change, appetite change and fever.  Respiratory:  Positive for cough and wheezing. Negative for shortness of breath.   Hematological: Negative.   Psychiatric/Behavioral: Negative.      Physical Exam Triage Vital Signs ED Triage Vitals  Enc Vitals Group     BP 07/20/21 1922 (!) 144/99     Pulse Rate 07/20/21 1922 (!) 104     Resp 07/20/21 1922 (!) 22     Temp 07/20/21 1922 98.3 F (36.8 C)     Temp  Source 07/20/21 1922 Oral     SpO2 07/20/21 1922 99 %     Weight 07/20/21 1920 (!) 306 lb (138.8 kg)     Height 07/20/21 1920 5\' 7"  (1.702 m)     Head Circumference --      Peak Flow --      Pain Score 07/20/21 1919 8     Pain Loc --      Pain Edu? --      Excl. in [place]? --    No data found.  Updated Vital Signs BP (!) 144/99 (BP Location: Right Arm)   Pulse (!) 104   Temp 98.3 F (36.8 C) (Oral)   Resp (!) 22   Ht 5\' 7"  (1.702 m)   Wt (!) 306 lb (138.8 kg)   LMP 07/06/2021   SpO2 99%   BMI 47.93 kg/m   Visual Acuity Right Eye Distance:   Left Eye Distance:   Bilateral Distance:    Right Eye Near:   Left Eye Near:    Bilateral Near:     Physical Exam Vitals and nursing note reviewed.  Constitutional:      General: She is not in acute distress.    Appearance: Normal appearance. She is obese. She is not ill-appearing.  HENT:     Head: Normocephalic and atraumatic.  Cardiovascular:     Rate and Rhythm: Normal rate and regular rhythm.     Pulses: Normal pulses.     Heart sounds: Normal heart sounds. No murmur heard.   No gallop.  Pulmonary:     Effort: Pulmonary effort is normal. No respiratory distress.     Breath sounds: Rhonchi present. No wheezing or rales.  Skin:    General: Skin is warm and dry.     Capillary Refill: Capillary refill takes less than 2 seconds.     Findings: No erythema or rash.  Neurological:     General: No focal deficit present.     Mental Status: She is alert and oriented to person, place, and time.  Psychiatric:        Mood and Affect: Mood normal.        Behavior: Behavior normal.        Thought Content: Thought content normal.        Judgment: Judgment normal.     UC Treatments / Results  Labs (all labs ordered are listed, but only abnormal results are displayed) Labs Reviewed - No data to display  EKG   Radiology No results found.  Procedures Procedures (including critical care time)  Medications Ordered in  UC Medications - No data to display  Initial Impression / Assessment and Plan / UC Course  I have reviewed the triage vital signs and the nursing notes.  Pertinent labs & imaging  results that were available during my care of the patient were reviewed by me and considered in my medical decision making (see chart for details).  Patient is a nontoxic-appearing 20 year old female here for evaluation of cough and painful respirations that started today.  She has been under treatment for pneumonia for the past 4 days with amoxicillin and azithromycin.  She states she finished her azithromycin today and does not feel any better.  She is also taking prednisone.  She is using albuterol inhaler without a spacer which is not offering significant relief of her cough or wheezing.  Patient's physical exam reveals a patient who is in no acute distress and can speak in full sentences without dyspnea or tachypnea.  Physical exam reveals coarse rhonchi in all lung fields without any wheezes or rales.  Chest x-ray taken 4 days ago did not show the presence of pneumonia.  Patient's physical exam is more consistent with bronchitis and I am concerned that she is developing pleurisy given the fact that it is painful for her to breathe and especially when she deep breathes.  Patient is moving good air in all lung fields.  We will have patient finish her antibiotic therapy and steroids.  I prescribed a spacer for her to get better delivery of her albuterol to help her with her wheezing.  We will also prescribe Tessalon Perles and Promethazine DM cough syrup to help patient with her cough.  Patient vies to return for new or worsening symptoms.   Final Clinical Impressions(s) / UC Diagnoses   Final diagnoses:  Pleurisy     Discharge Instructions      Use the spacer with your inhaler so you get better delivery of your medication.  Use the Tessalon perles during the day for cough and use the promethazine DM cough syrup at  bedtime.  Return for re-evaluation for new or worsening symptoms.      ED Prescriptions     Medication Sig Dispense Auth. Provider   benzonatate (TESSALON) 100 MG capsule Take 2 capsules (200 mg total) by mouth every 8 (eight) hours. 21 capsule Becky Augusta, NP   promethazine-dextromethorphan (PROMETHAZINE-DM) 6.25-15 MG/5ML syrup Take 5 mLs by mouth 4 (four) times daily as needed. 118 mL Becky Augusta, NP   Spacer/Aero-Holding Deretha Emory (AEROCHAMBER MV) inhaler Use as instructed 1 each Becky Augusta, NP      PDMP not reviewed this encounter.   Becky Augusta, NP 07/20/21 1943

## 2021-07-20 NOTE — Discharge Instructions (Addendum)
Use the spacer with your inhaler so you get better delivery of your medication.  Use the Tessalon perles during the day for cough and use the promethazine DM cough syrup at bedtime.  Return for re-evaluation for new or worsening symptoms.

## 2021-07-20 NOTE — ED Triage Notes (Signed)
Patient states that she has been having a cough since last week and worsened over the last few days. Reports that she had covid in July. States that she was diagnosed with Pneumonia on Friday and given Amoxicillin, Albuterol and Prednisone and Azithromycin. States that she has started to have a harder time breathing and has pain in her lungs that radiates to her back.

## 2021-10-13 ENCOUNTER — Ambulatory Visit
Admission: EM | Admit: 2021-10-13 | Discharge: 2021-10-13 | Disposition: A | Discharge status: Home / Self Care | Payer: BC Managed Care – PPO | Attending: Emergency Medicine | Admitting: Emergency Medicine

## 2021-10-13 ENCOUNTER — Other Ambulatory Visit: Payer: Self-pay

## 2021-10-13 DIAGNOSIS — J069 Acute upper respiratory infection, unspecified: Secondary | ICD-10-CM

## 2021-10-13 DIAGNOSIS — H66002 Acute suppurative otitis media without spontaneous rupture of ear drum, left ear: Secondary | ICD-10-CM

## 2021-10-13 MED ORDER — ALBUTEROL SULFATE HFA 108 (90 BASE) MCG/ACT IN AERS: 1.0000 | INHALATION_SPRAY | Freq: Four times a day (QID) | RESPIRATORY_TRACT | 0 refills | Status: AC | PRN

## 2021-10-13 MED ORDER — DOXYCYCLINE HYCLATE 100 MG PO CAPS: 100.0000 mg | ORAL_CAPSULE | Freq: Two times a day (BID) | ORAL | 0 refills | Status: DC

## 2021-10-13 NOTE — Discharge Instructions (Addendum)
Rest, push fluids, take medication as directed.  Recommend Coricidin HBP for symptom management.  Take Tylenol for any fever or discomfort.

## 2021-10-13 NOTE — ED Provider Notes (Signed)
[facility]    CSN: [id number] Arrival date & time: 10/13/21  1529      History   Chief Complaint Chief Complaint  Patient presents with   Cough   Nasal Congestion    HPI Amy Conley is a 20 y.o. female.   20 year old female patient, Amy Conley, presents to urgent care with chief complaint of cough congestion for 5 days.  Patient states she awakened this morning with left ear pain.  Patient states she had COVID over the summer requiring inhaler for wheezing.  Patient states she has been taking NyQuil for symptom management.  She denies any shortness of breath chest pain or palpitations.  Last menstrual period was 2 weeks prior, patient's only known allergy is Bactrim.  She had exposure to someone with similar flulike symptoms recently.  The history is provided by the patient. No language interpreter was used.   Past Medical History:  Diagnosis Date   Allergy    SEASONAL ALLERGIES-USES ALBUTEROL INHALER PRN   Headache    MIGRAINES-TAKES PHENERGAN AND ZOFRAN   Obesity     Patient Active Problem List   Diagnosis Date Noted   Viral URI with cough 10/13/2021   Acute suppurative otitis media of left ear without spontaneous rupture of tympanic membrane 10/13/2021   Urticaria 10/16/2015    Past Surgical History:  Procedure Laterality Date   TONSILLECTOMY AND ADENOIDECTOMY Bilateral 10/03/2016   Procedure: TONSILLECTOMY AND ADENOIDECTOMY;  Surgeon: Vernie Murders, MD;  Location: [facility];  Service: ENT;  Laterality: Bilateral;    OB History   No obstetric history on file.      Home Medications    Prior to Admission medications   Medication Sig Start Date End Date Taking? Authorizing Provider  albuterol (VENTOLIN HFA) 108 (90 Base) MCG/ACT inhaler Inhale 1-2 puffs into the lungs every 6 (six) hours as needed for wheezing or shortness of breath. 10/13/21  Yes Ermin Parisien, Para March, NP  doxycycline (VIBRAMYCIN) 100 MG capsule Take 1 capsule (100 mg total) by  mouth 2 (two) times daily. 10/13/21  Yes Alaa Eyerman, Para March, NP  norethindrone-ethinyl estradiol-iron (ESTROSTEP FE,TILIA FE,TRI-LEGEST FE) 1-20/1-30/1-35 MG-MCG tablet Take 1 tablet by mouth daily.   Yes [provider]  acetaminophen (TYLENOL) 325 MG tablet Take by mouth.    [provider]  ALLERGY RELIEF 180 MG tablet Take 180 mg by mouth daily. 12/20/19   [provider]  APRI 0.15-30 MG-MCG tablet Take 1 tablet by mouth daily. 03/13/20   [provider]  benzonatate (TESSALON) 100 MG capsule Take 2 capsules (200 mg total) by mouth every 8 (eight) hours. 07/20/21   Becky Augusta, NP  cetirizine (ZYRTEC) 10 MG tablet Take by mouth.    [provider]  diphenhydrAMINE (BENADRYL) 25 mg capsule Take by mouth.    [provider]  EPINEPHrine 0.3 mg/0.3 mL IJ SOAJ injection 0.3 mg as needed for anaphylaxis.    [provider]  ipratropium (ATROVENT) 0.06 % nasal spray Place 2 sprays into both nostrils 4 (four) times daily. 03/07/21   Becky Augusta, NP  promethazine-dextromethorphan (PROMETHAZINE-DM) 6.25-15 MG/5ML syrup Take 5 mLs by mouth 4 (four) times daily as needed. 07/20/21   Becky Augusta, NP  Spacer/Aero-Holding Deretha Emory (AEROCHAMBER MV) inhaler Use as instructed 07/20/21   Becky Augusta, NP  etonogestrel (NEXPLANON) 68 MG IMPL implant 1 each by Subdermal route once.  03/07/21  [provider]    Family History Family History  Problem Relation Age of Onset  Healthy Mother    Healthy Father     Social History Social History   Tobacco Use   Smoking status: Never   Smokeless tobacco: Never  Vaping Use   Vaping Use: Never used  Substance Use Topics   Alcohol use: No   Drug use: No     Allergies   Food and Sulfa antibiotics   Review of Systems Review of Systems  Constitutional:  Negative for fever.  HENT:  Positive for congestion.   Respiratory:  Positive for cough and wheezing. Negative for stridor.   All other  systems reviewed and are negative.   Physical Exam Triage Vital Signs ED Triage Vitals  Enc Vitals Group     BP 10/13/21 1557 (!) 149/104     Pulse Rate 10/13/21 1557 (!) 103     Resp 10/13/21 1557 16     Temp 10/13/21 1557 98.8 F (37.1 C)     Temp Source 10/13/21 1557 Oral     SpO2 10/13/21 1557 99 %     Weight 10/13/21 1556 (!) 306 lb (138.8 kg)     Height 10/13/21 1556 5\' 7"  (1.702 m)     Head Circumference --      Peak Flow --      Pain Score 10/13/21 1556 8     Pain Loc --      Pain Edu? --      Excl. in GC? --    No data found.  Updated Vital Signs BP (!) 149/104 (BP Location: Left Arm)   Pulse (!) 103   Temp 98.8 F (37.1 C) (Oral)   Resp 16   Ht 5\' 7"  (1.702 m)   Wt (!) 306 lb (138.8 kg)   LMP 09/29/2021 (Approximate)   SpO2 99%   BMI 47.93 kg/m   Visual Acuity Right Eye Distance:   Left Eye Distance:   Bilateral Distance:    Right Eye Near:   Left Eye Near:    Bilateral Near:     Physical Exam Vitals and nursing note reviewed.  Constitutional:      General: She is not in acute distress.    Appearance: Normal appearance. She is well-developed and well-groomed.  HENT:     Head: Normocephalic.     Right Ear: Tympanic membrane is retracted. Tympanic membrane is not erythematous or bulging.     Left Ear: Tympanic membrane is erythematous, retracted and bulging.     Nose: Congestion present.     Mouth/Throat:     Lips: Pink.     Mouth: Mucous membranes are moist.     Pharynx: Oropharynx is clear.  Eyes:     General: Lids are normal.     Conjunctiva/sclera: Conjunctivae normal.     Pupils: Pupils are equal, round, and reactive to light.  Neck:     Trachea: No tracheal deviation.  Cardiovascular:     Rate and Rhythm: Regular rhythm. Tachycardia present.     Pulses: Normal pulses.     Heart sounds: Normal heart sounds. No murmur heard. Pulmonary:     Effort: Pulmonary effort is normal.     Breath sounds: Normal breath sounds.     Comments:  Mild expiratory wheezing bilaterally Abdominal:     General: Bowel sounds are normal.     Palpations: Abdomen is soft.     Tenderness: There is no abdominal tenderness.  Musculoskeletal:        General: Normal range of motion.     Cervical back:  Normal range of motion.  Lymphadenopathy:     Cervical: No cervical adenopathy.  Skin:    General: Skin is warm and dry.     Findings: No rash.  Neurological:     General: No focal deficit present.     Mental Status: She is alert and oriented to person, place, and time.     GCS: GCS eye subscore is 4. GCS verbal subscore is 5. GCS motor subscore is 6.     Cranial Nerves: Cranial nerves 2-12 are intact.     Sensory: Sensation is intact.     Motor: Motor function is intact.     Coordination: Coordination is intact.  Psychiatric:        Speech: Speech normal.        Behavior: Behavior normal. Behavior is cooperative.     UC Treatments / Results  Labs (all labs ordered are listed, but only abnormal results are displayed) Labs Reviewed - No data to display  EKG   Radiology No results found.  Procedures Procedures (including critical care time)  Medications Ordered in UC Medications - No data to display  Initial Impression / Assessment and Plan / UC Course  I have reviewed the triage vital signs and the nursing notes.  Pertinent labs & imaging results that were available during my care of the patient were reviewed by me and considered in my medical decision making (see chart for details).     Ddx: Left AOM, Viral illness Final Clinical Impressions(s) / UC Diagnoses   Final diagnoses:  Viral URI with cough  Acute suppurative otitis media of left ear without spontaneous rupture of tympanic membrane, recurrence not specified     Discharge Instructions      Rest, push fluids, take medication as directed.  Recommend Coricidin HBP for symptom management.  Take Tylenol for any fever or discomfort.     ED Prescriptions      Medication Sig Dispense Auth. Provider   albuterol (VENTOLIN HFA) 108 (90 Base) MCG/ACT inhaler Inhale 1-2 puffs into the lungs every 6 (six) hours as needed for wheezing or shortness of breath. 1 each Richard Holz, Para March, NP   doxycycline (VIBRAMYCIN) 100 MG capsule Take 1 capsule (100 mg total) by mouth 2 (two) times daily. 20 capsule Quintin Hjort, Para March, NP      PDMP not reviewed this encounter.   Clancy Gourd, NP 10/13/21 1737

## 2021-10-13 NOTE — ED Triage Notes (Signed)
Pt c/o of cough, chest congestion, left ear pain. Pt was around people 3 days ago that test positive for flu.

## 2022-01-19 ENCOUNTER — Ambulatory Visit
Admission: EM | Admit: 2022-01-19 | Discharge: 2022-01-19 | Disposition: A | Discharge status: Home / Self Care | Payer: BC Managed Care – PPO | Attending: Emergency Medicine | Admitting: Emergency Medicine

## 2022-01-19 ENCOUNTER — Other Ambulatory Visit: Payer: Self-pay

## 2022-01-19 ENCOUNTER — Encounter: Payer: Self-pay | Admitting: Emergency Medicine

## 2022-01-19 DIAGNOSIS — H9203 Otalgia, bilateral: Secondary | ICD-10-CM | POA: Diagnosis not present

## 2022-01-19 DIAGNOSIS — J209 Acute bronchitis, unspecified: Secondary | ICD-10-CM | POA: Diagnosis not present

## 2022-01-19 DIAGNOSIS — R051 Acute cough: Secondary | ICD-10-CM | POA: Diagnosis not present

## 2022-01-19 MED ORDER — LEVOCETIRIZINE DIHYDROCHLORIDE 5 MG PO TABS: 5.0000 mg | ORAL_TABLET | Freq: Every evening | ORAL | 0 refills | Status: DC

## 2022-01-19 MED ORDER — PREDNISONE 10 MG PO TABS: 20.0000 mg | ORAL_TABLET | Freq: Every day | ORAL | 0 refills | Status: DC

## 2022-01-19 MED ORDER — BENZONATATE 100 MG PO CAPS: 200.0000 mg | ORAL_CAPSULE | Freq: Three times a day (TID) | ORAL | 0 refills | Status: DC

## 2022-01-19 NOTE — Discharge Instructions (Addendum)
Take over-the-counter cough and cold medications as needed.  Take Tylenol Motrin as needed for any fever ?You most likely have something viral in nature ?Use humidifier at nighttime stay elevated with 2 pillows ?Cough may persist for several weeks ? ?

## 2022-01-19 NOTE — ED Provider Notes (Signed)
?[facility] ? ? ? ?CSN: [id number] ?Arrival date & time: 01/19/22  0848 ? ? ?  ? ?History   ?Chief Complaint ?Chief Complaint  ?Patient presents with  ? Cough  ? ? ?HPI ?Amy Conley is a 21 y.o. female.  ? ?Patient presents today with cough congestion ear pain for approximately 3 days now.  Denies any shortness of breath no fever.  States she has an albuterol inhaler at home since June but did not use it because it increased her heart rate last time.  Patient not taking anything else over-the-counter.  Does have intermittent seasonal allergies ? ? ?Past Medical History:  ?Diagnosis Date  ? Allergy   ? SEASONAL ALLERGIES-USES ALBUTEROL INHALER PRN  ? Headache   ? MIGRAINES-TAKES PHENERGAN AND ZOFRAN  ? Obesity   ? ? ?Patient Active Problem List  ? Diagnosis Date Noted  ? Viral URI with cough 10/13/2021  ? Acute suppurative otitis media of left ear without spontaneous rupture of tympanic membrane 10/13/2021  ? Urticaria 10/16/2015  ? ? ?Past Surgical History:  ?Procedure Laterality Date  ? TONSILLECTOMY AND ADENOIDECTOMY Bilateral 10/03/2016  ? Procedure: TONSILLECTOMY AND ADENOIDECTOMY;  Surgeon: Vernie Murders, MD;  Location: [facility];  Service: ENT;  Laterality: Bilateral;  ? ? ?OB History   ?No obstetric history on file. ?  ? ? ? ?Home Medications   ? ?Prior to Admission medications   ?Medication Sig Start Date End Date Taking? Authorizing Provider  ?levocetirizine (XYZAL ALLERGY 24HR) 5 MG tablet Take 1 tablet (5 mg total) by mouth every evening. 01/19/22  Yes Coralyn Mark, NP  ?norethindrone-ethinyl estradiol-iron (ESTROSTEP FE,TILIA FE,TRI-LEGEST FE) 1-20/1-30/1-35 MG-MCG tablet Take 1 tablet by mouth daily.   Yes [provider]  ?predniSONE (DELTASONE) 10 MG tablet Take 2 tablets (20 mg total) by mouth daily. 01/19/22  Yes Coralyn Mark, NP  ?acetaminophen (TYLENOL) 325 MG tablet Take by mouth.    [provider]  ?albuterol (VENTOLIN HFA) 108 (90 Base) MCG/ACT inhaler  Inhale 1-2 puffs into the lungs every 6 (six) hours as needed for wheezing or shortness of breath. 10/13/21   DefelicePara March, NP  ?APRI 0.15-30 MG-MCG tablet Take 1 tablet by mouth daily. 03/13/20   [provider]  ?benzonatate (TESSALON) 100 MG capsule Take 2 capsules (200 mg total) by mouth every 8 (eight) hours. 01/19/22   Coralyn Mark, NP  ?diphenhydrAMINE (BENADRYL) 25 mg capsule Take by mouth.    [provider]  ?doxycycline (VIBRAMYCIN) 100 MG capsule Take 1 capsule (100 mg total) by mouth 2 (two) times daily. 10/13/21   Defelice, Para March, NP  ?EPINEPHrine 0.3 mg/0.3 mL IJ SOAJ injection 0.3 mg as needed for anaphylaxis.    [provider]  ?ipratropium (ATROVENT) 0.06 % nasal spray Place 2 sprays into both nostrils 4 (four) times daily. 03/07/21   Becky Augusta, NP  ?promethazine-dextromethorphan (PROMETHAZINE-DM) 6.25-15 MG/5ML syrup Take 5 mLs by mouth 4 (four) times daily as needed. 07/20/21   Becky Augusta, NP  ?Spacer/Aero-Holding Chambers (AEROCHAMBER MV) inhaler Use as instructed 07/20/21   Becky Augusta, NP  ?etonogestrel (NEXPLANON) 68 MG IMPL implant 1 each by Subdermal route once.  03/07/21  [provider]  ? ? ?Family History ?Family History  ?Problem Relation Age of Onset  ? Healthy Mother   ? Healthy Father   ? ? ?Social History ?Social History  ? ?Tobacco Use  ? Smoking status: Never  ? Smokeless tobacco: Never  ?Vaping Use  ?  Vaping Use: Never used  ?Substance Use Topics  ? Alcohol use: No  ? Drug use: No  ? ? ? ?Allergies   ?Food and Sulfa antibiotics ? ? ?Review of Systems ?Review of Systems  ?Constitutional:  Negative for activity change, fatigue and fever.  ?HENT:  Positive for congestion, ear pain, postnasal drip, rhinorrhea, sinus pressure and sinus pain. Negative for sore throat.   ?Eyes: Negative.   ?Respiratory:  Positive for cough. Negative for shortness of breath and wheezing.   ?Cardiovascular: Negative.   ?Gastrointestinal: Negative.    ?Genitourinary: Negative.   ?Skin: Negative.   ?Neurological: Negative.   ? ? ?Physical Exam ?Triage Vital Signs ?ED Triage Vitals  ?Enc Vitals Group  ?   BP 01/19/22 0902 (!) 142/91  ?   Pulse Rate 01/19/22 0902 99  ?   Resp 01/19/22 0902 18  ?   Temp 01/19/22 0902 98.2 ?F (36.8 ?C)  ?   Temp Source 01/19/22 0902 Oral  ?   SpO2 01/19/22 0902 98 %  ?   Weight 01/19/22 0859 (!) 306 lb (138.8 kg)  ?   Height 01/19/22 0859 5\' 7"  (1.702 m)  ?   Head Circumference --   ?   Peak Flow --   ?   Pain Score 01/19/22 0858 3  ?   Pain Loc --   ?   Pain Edu? --   ?   Excl. in GC? --   ? ?No data found. ? ?Updated Vital Signs ?BP (!) 142/91 (BP Location: Right Arm)   Pulse 99   Temp 98.2 ?F (36.8 ?C) (Oral)   Resp 18   Ht 5\' 7"  (1.702 m)   Wt (!) 306 lb (138.8 kg)   LMP 01/16/2022 (Approximate)   SpO2 98%   BMI 47.93 kg/m?  ? ?Visual Acuity ?Right Eye Distance:   ?Left Eye Distance:   ?Bilateral Distance:   ? ?Right Eye Near:   ?Left Eye Near:    ?Bilateral Near:    ? ?Physical Exam ?Constitutional:   ?   Appearance: Normal appearance.  ?HENT:  ?   Right Ear: Tympanic membrane normal.  ?   Left Ear: Tympanic membrane normal.  ?   Nose: Congestion present.  ?   Mouth/Throat:  ?   Mouth: Mucous membranes are moist.  ?Eyes:  ?   Pupils: Pupils are equal, round, and reactive to light.  ?Cardiovascular:  ?   Rate and Rhythm: Normal rate.  ?Pulmonary:  ?   Effort: Pulmonary effort is normal.  ?Abdominal:  ?   General: Abdomen is flat.  ?Musculoskeletal:     ?   General: Normal range of motion.  ?Skin: ?   General: Skin is warm.  ?   Capillary Refill: Capillary refill takes less than 2 seconds.  ?Neurological:  ?   General: No focal deficit present.  ?   Mental Status: She is alert.  ? ? ? ?UC Treatments / Results  ?Labs ?(all labs ordered are listed, but only abnormal results are displayed) ?Labs Reviewed - No data to display ? ?EKG ? ? ?Radiology ?No results found. ? ?Procedures ?Procedures (including critical care  time) ? ?Medications Ordered in UC ?Medications - No data to display ? ?Initial Impression / Assessment and Plan / UC Course  ?I have reviewed the triage vital signs and the nursing notes. ? ?Pertinent labs & imaging results that were available during my care of the patient were reviewed by me and considered  in my medical decision making (see chart for details). ? ?  ? ?Take over-the-counter cough and cold medications as needed.  Take Tylenol Motrin as needed for any fever ?You most likely have something viral in nature ?Use humidifier at nighttime stay elevated with 2 pillows ?Cough may persist for several weeks ? ? ?Final Clinical Impressions(s) / UC Diagnoses  ? ?Final diagnoses:  ?Acute cough  ?Acute bronchitis, unspecified organism  ?Ear pain, bilateral  ? ? ? ?Discharge Instructions   ? ?  ?Take over-the-counter cough and cold medications as needed.  Take Tylenol Motrin as needed for any fever ?You most likely have something viral in nature ?Use humidifier at nighttime stay elevated with 2 pillows ?Cough may persist for several weeks ? ? ? ? ? ?ED Prescriptions   ? ? Medication Sig Dispense Auth. Provider  ? benzonatate (TESSALON) 100 MG capsule Take 2 capsules (200 mg total) by mouth every 8 (eight) hours. 21 capsule Maple MirzaMitchell, Jerney Baksh L, NP  ? levocetirizine (XYZAL ALLERGY 24HR) 5 MG tablet Take 1 tablet (5 mg total) by mouth every evening. 30 tablet Maple MirzaMitchell, Aiyana Stegmann L, NP  ? predniSONE (DELTASONE) 10 MG tablet Take 2 tablets (20 mg total) by mouth daily. 15 tablet Coralyn MarkMitchell, Aminta Sakurai L, NP  ? ?  ? ?PDMP not reviewed this encounter. ?  ?Coralyn MarkMitchell, Shakura Cowing L, NP ?01/19/22 [phone] ? ?

## 2022-01-19 NOTE — ED Triage Notes (Signed)
Pt c/o cough, wheezing, chest congestion, sputum production, and chest pain. Started about 3 days ago. Denies fever.  ?

## 2022-07-20 ENCOUNTER — Ambulatory Visit
Admission: EM | Admit: 2022-07-20 | Discharge: 2022-07-20 | Disposition: A | Discharge status: Home / Self Care | Payer: 59 | Attending: Family Medicine | Admitting: Family Medicine

## 2022-07-20 ENCOUNTER — Encounter: Payer: Self-pay | Admitting: Emergency Medicine

## 2022-07-20 DIAGNOSIS — N760 Acute vaginitis: Secondary | ICD-10-CM

## 2022-07-20 DIAGNOSIS — B9689 Other specified bacterial agents as the cause of diseases classified elsewhere: Secondary | ICD-10-CM | POA: Diagnosis present

## 2022-07-20 DIAGNOSIS — N3000 Acute cystitis without hematuria: Secondary | ICD-10-CM

## 2022-07-20 LAB — URINALYSIS, ROUTINE W REFLEX MICROSCOPIC
Bilirubin Urine: NEGATIVE
Glucose, UA: NEGATIVE mg/dL
Hgb urine dipstick: NEGATIVE
Ketones, ur: NEGATIVE mg/dL
Nitrite: NEGATIVE
Protein, ur: NEGATIVE mg/dL
Specific Gravity, Urine: 1.025 (ref 1.005–1.030)
pH: 5 (ref 5.0–8.0)

## 2022-07-20 LAB — WET PREP, GENITAL
Sperm: NONE SEEN
Trich, Wet Prep: NONE SEEN
WBC, Wet Prep HPF POC: >10 — AB (ref <10)
Yeast Wet Prep HPF POC: NONE SEEN

## 2022-07-20 LAB — PREGNANCY, URINE: Preg Test, Ur: NEGATIVE

## 2022-07-20 LAB — URINALYSIS, MICROSCOPIC (REFLEX)

## 2022-07-20 MED ORDER — METRONIDAZOLE 500 MG PO TABS: 500.0000 mg | ORAL_TABLET | Freq: Two times a day (BID) | ORAL | 0 refills | Status: DC

## 2022-07-20 MED ORDER — CEPHALEXIN 500 MG PO CAPS: 500.0000 mg | ORAL_CAPSULE | Freq: Four times a day (QID) | ORAL | 0 refills | Status: DC

## 2022-07-20 NOTE — ED Triage Notes (Signed)
Pt presents with lower abdominal pressure and groin pain that started today. Pt denies any urinary symptoms. Her LMP was in July and she spotted a little in August she is also concerned for pregnancy.

## 2022-07-20 NOTE — ED Provider Notes (Signed)
[facility]    CSN: [id number] Arrival date & time: 07/20/22  1838      History   Chief Complaint Chief Complaint  Patient presents with   Abdominal Pain     HPI HPI Amy Conley is a 21 y.o. female.   [name] reports abdominal pressure "like bloating" almost like she is about to get her period. Has cramping.  She hasn't had a period since July.  Her friend told her to get a pregnancy test. Denies dysuria, urinary urgency. She has some urinary urgency but has been drinking more green tea. No blood in urine or stool. She had  a UTI in July 2022.  She took Tylenol last night because she had a headache. She has a some mild vaginal discharge. No odor. No concern for STDs but does not use condoms regularly. Her partner is not having any symptoms. Patient's last menstrual period was 05/19/2022.  She had spotting in the beginning for August that lasted 2 days, blood seen only with wiping.    - Contraception: no - Abnormal vaginal discharge: yes - Missed period: yes   - Fever: no - Abdominal pain: yes  - Pelvic pain: no - Vaginal bleeding: no - Pain during sex: no - Rash: no - Sore throat: no   - Arthralgias: no - Nausea: no - Vomiting: no - breast tenderness: no  - Dysuria: no - Back Pain: no - Headache: no       Past Medical History:  Diagnosis Date   Allergy    SEASONAL ALLERGIES-USES ALBUTEROL INHALER PRN   Headache    MIGRAINES-TAKES PHENERGAN AND ZOFRAN   Obesity     Patient Active Problem List   Diagnosis Date Noted   Viral URI with cough 10/13/2021   Acute suppurative otitis media of left ear without spontaneous rupture of tympanic membrane 10/13/2021   Urticaria 10/16/2015    Past Surgical History:  Procedure Laterality Date   TONSILLECTOMY AND ADENOIDECTOMY Bilateral 10/03/2016   Procedure: TONSILLECTOMY AND ADENOIDECTOMY;  Surgeon: Vernie Murders, MD;  Location: [facility];  Service: ENT;  Laterality: Bilateral;    OB History   No  obstetric history on file.      Home Medications    Prior to Admission medications   Medication Sig Start Date End Date Taking? Authorizing Provider  cephALEXin (KEFLEX) 500 MG capsule Take 1 capsule (500 mg total) by mouth 4 (four) times daily. 07/20/22  Yes Indea Dearman, DO  metroNIDAZOLE (FLAGYL) 500 MG tablet Take 1 tablet (500 mg total) by mouth 2 (two) times daily. 07/20/22  Yes Emmalynne Courtney, DO  acetaminophen (TYLENOL) 325 MG tablet Take by mouth.    [provider]  albuterol (VENTOLIN HFA) 108 (90 Base) MCG/ACT inhaler Inhale 1-2 puffs into the lungs every 6 (six) hours as needed for wheezing or shortness of breath. 10/13/21   Defelice, Para March, NP  APRI 0.15-30 MG-MCG tablet Take 1 tablet by mouth daily. 03/13/20   [provider]  benzonatate (TESSALON) 100 MG capsule Take 2 capsules (200 mg total) by mouth every 8 (eight) hours. 01/19/22   Coralyn Mark, NP  diphenhydrAMINE (BENADRYL) 25 mg capsule Take by mouth.    [provider]  EPINEPHrine 0.3 mg/0.3 mL IJ SOAJ injection 0.3 mg as needed for anaphylaxis.    [provider]  ipratropium (ATROVENT) 0.06 % nasal spray Place 2 sprays into both nostrils 4 (four) times daily. 03/07/21   Becky Augusta, NP  levocetirizine Elita Boone ALLERGY  24HR) 5 MG tablet Take 1 tablet (5 mg total) by mouth every evening. 01/19/22   Coralyn Mark, NP  norethindrone-ethinyl estradiol-iron (ESTROSTEP FE,TILIA FE,TRI-LEGEST FE) 1-20/1-30/1-35 MG-MCG tablet Take 1 tablet by mouth daily.    [provider]  predniSONE (DELTASONE) 10 MG tablet Take 2 tablets (20 mg total) by mouth daily. 01/19/22   Coralyn Mark, NP  promethazine-dextromethorphan (PROMETHAZINE-DM) 6.25-15 MG/5ML syrup Take 5 mLs by mouth 4 (four) times daily as needed. 07/20/21   Becky Augusta, NP  Spacer/Aero-Holding Deretha Emory (AEROCHAMBER MV) inhaler Use as instructed 07/20/21   Becky Augusta, NP  etonogestrel (NEXPLANON) 68 MG IMPL implant  1 each by Subdermal route once.  03/07/21  [provider]    Family History Family History  Problem Relation Age of Onset   Healthy Mother    Healthy Father     Social History Social History   Tobacco Use   Smoking status: Never   Smokeless tobacco: Never  Vaping Use   Vaping Use: Never used  Substance Use Topics   Alcohol use: No   Drug use: No     Allergies   Peanut (diagnostic), Food, and Sulfa antibiotics   Review of Systems Review of Systems: :negative unless otherwise stated in HPI.      Physical Exam Triage Vital Signs ED Triage Vitals  Enc Vitals Group     BP 07/20/22 1911 (!) 151/113     Pulse Rate 07/20/22 1911 97     Resp 07/20/22 1911 18     Temp 07/20/22 1911 98.1 F (36.7 C)     Temp Source 07/20/22 1911 Oral     SpO2 07/20/22 1911 100 %     Weight --      Height --      Head Circumference --      Peak Flow --      Pain Score 07/20/22 1909 0     Pain Loc --      Pain Edu? --      Excl. in [place]? --    No data found.  Updated Vital Signs BP (!) 151/113 (BP Location: Left Wrist)   Pulse 97   Temp 98.1 F (36.7 C) (Oral)   Resp 18   LMP 05/19/2022   SpO2 100%   Visual Acuity Right Eye Distance:   Left Eye Distance:   Bilateral Distance:    Right Eye Near:   Left Eye Near:    Bilateral Near:     Physical Exam GEN: well appearing female in no acute distress  CVS: well perfused  RESP: speaking in full sentences without pause  ABD: soft, non-tender, non-distended, no palpable masses, no CVA tenderness, bowel sounds present   GU: deferred, patient performed self swab  SKIN: warm and dry    UC Treatments / Results  Labs (all labs ordered are listed, but only abnormal results are displayed) Labs Reviewed  WET PREP, GENITAL - Abnormal; Notable for the following components:      Result Value   Clue Cells Wet Prep HPF POC PRESENT (*)    WBC, Wet Prep HPF POC >10 (*)    All other components within normal limits   URINALYSIS, ROUTINE W REFLEX MICROSCOPIC - Abnormal; Notable for the following components:   APPearance HAZY (*)    Leukocytes,Ua TRACE (*)    All other components within normal limits  URINALYSIS, MICROSCOPIC (REFLEX) - Abnormal; Notable for the following components:   Bacteria, UA FEW (*)  All other components within normal limits  PREGNANCY, URINE    EKG   Radiology No results found.  Procedures Procedures (including critical care time)  Medications Ordered in UC Medications - No data to display  Initial Impression / Assessment and Plan / UC Course  I have reviewed the triage vital signs and the nursing notes.  Pertinent labs & imaging results that were available during my care of the patient were reviewed by me and considered in my medical decision making (see chart for details).      Acute cystitis:  Patient is a 21 y.o. female  who presents for 2 days of abdominal pressure and vaginal discharge.  Overall patient is well-appearing and afebrile.  Urine pregnancy test was negative.  UA consistent with pyuria.  Hematuria not supported on microscopy.  Patient has had 1 UTI in the past year. Treat with Keflex 4 times daily for 5 days. Follow-up sensitivities and change antibiotics, if needed.   BV (bacterial vaginosis) confirmed on wet prep.  Gonorrhea and chlamydia, HIV and RPR offered but declined.  Treat with Flagyl.  Advised patient to not drink alcohol with taking this medication.  She is to follow-up if symptoms not improving or getting worse. Return precautions including abdominal pain, fever, chills, nausea, or vomiting given.   Discussed MDM, treatment plan and plan for follow-up with patient who agrees with plan.       Final Clinical Impressions(s) / UC Diagnoses   Final diagnoses:  BV (bacterial vaginosis)  Acute cystitis without hematuria     Discharge Instructions      Stop by the pharmacy to pick up your prescriptions.  Follow up with your primary  care provider as needed.      ED Prescriptions     Medication Sig Dispense Auth. Provider   cephALEXin (KEFLEX) 500 MG capsule Take 1 capsule (500 mg total) by mouth 4 (four) times daily. 20 capsule Ilse Billman, DO   metroNIDAZOLE (FLAGYL) 500 MG tablet Take 1 tablet (500 mg total) by mouth 2 (two) times daily. 14 tablet Jeralynn Vaquera, Seward Meth, DO      PDMP not reviewed this encounter.   Katha Cabal, DO 07/21/22 0117

## 2022-07-20 NOTE — Discharge Instructions (Signed)
Stop by the pharmacy to pick up your prescriptions.  Follow up with your primary care provider as needed.  

## 2022-08-31 ENCOUNTER — Ambulatory Visit
Admission: EM | Admit: 2022-08-31 | Discharge: 2022-08-31 | Disposition: A | Discharge status: Home / Self Care | Payer: 59 | Attending: Family Medicine | Admitting: Family Medicine

## 2022-08-31 ENCOUNTER — Ambulatory Visit (INDEPENDENT_AMBULATORY_CARE_PROVIDER_SITE_OTHER): Payer: 59

## 2022-08-31 DIAGNOSIS — M79672 Pain in left foot: Secondary | ICD-10-CM

## 2022-08-31 DIAGNOSIS — S91312A Laceration without foreign body, left foot, initial encounter: Secondary | ICD-10-CM | POA: Diagnosis not present

## 2022-08-31 MED ORDER — CEPHALEXIN 500 MG PO CAPS
500.0000 mg | ORAL_CAPSULE | Freq: Three times a day (TID) | ORAL | 0 refills | Status: AC
Start: 2022-08-31 — End: 2022-09-07

## 2022-08-31 MED ORDER — NAPROXEN 500 MG PO TABS
500.0000 mg | ORAL_TABLET | Freq: Two times a day (BID) | ORAL | 0 refills | Status: DC
Start: 2022-08-31 — End: 2022-12-21

## 2022-08-31 NOTE — ED Provider Notes (Signed)
[facility]    CSN: [id number] Arrival date & time: 08/31/22  1721      History   Chief Complaint Chief Complaint  Patient presents with   Wound Check    LT heel    HPI  HPI Amy Conley is a 21 y.o. female.   Amy Conley presents for heel wound that occurred Saturday morning when the storm door smacked her in the back of her left heel.  She has been walking on her tiptoes since.  There is an open wound on the back of her heel.  Denies any fever, nausea, vomiting, calf pain, knee pain or extremity weakness.  She has been taking some over-the-counter pain medications without relief.      Past Medical History:  Diagnosis Date   Allergy    SEASONAL ALLERGIES-USES ALBUTEROL INHALER PRN   Headache    MIGRAINES-TAKES PHENERGAN AND ZOFRAN   Obesity     Patient Active Problem List   Diagnosis Date Noted   Viral URI with cough 10/13/2021   Acute suppurative otitis media of left ear without spontaneous rupture of tympanic membrane 10/13/2021   Urticaria 10/16/2015    Past Surgical History:  Procedure Laterality Date   TONSILLECTOMY AND ADENOIDECTOMY Bilateral 10/03/2016   Procedure: TONSILLECTOMY AND ADENOIDECTOMY;  Surgeon: Vernie Murders, MD;  Location: [facility];  Service: ENT;  Laterality: Bilateral;    OB History   No obstetric history on file.      Home Medications    Prior to Admission medications   Medication Sig Start Date End Date Taking? Authorizing Provider  naproxen (NAPROSYN) 500 MG tablet Take 1 tablet (500 mg total) by mouth 2 (two) times daily with a meal. 08/31/22  Yes Bernon Arviso, DO  norethindrone-ethinyl estradiol-iron (ESTROSTEP FE,TILIA FE,TRI-LEGEST FE) 1-20/1-30/1-35 MG-MCG tablet Take 1 tablet by mouth daily.   Yes [provider]  acetaminophen (TYLENOL) 325 MG tablet Take by mouth.    [provider]  albuterol (VENTOLIN HFA) 108 (90 Base) MCG/ACT inhaler Inhale 1-2 puffs into the lungs every 6 (six) hours as  needed for wheezing or shortness of breath. 10/13/21   Defelice, Para March, NP  APRI 0.15-30 MG-MCG tablet Take 1 tablet by mouth daily. 03/13/20   [provider]  benzonatate (TESSALON) 100 MG capsule Take 2 capsules (200 mg total) by mouth every 8 (eight) hours. 01/19/22   Coralyn Mark, NP  cephALEXin (KEFLEX) 500 MG capsule Take 1 capsule (500 mg total) by mouth 3 (three) times daily for 7 days. 08/31/22 09/07/22  Katha Cabal, DO  diphenhydrAMINE (BENADRYL) 25 mg capsule Take by mouth.    [provider]  EPINEPHrine 0.3 mg/0.3 mL IJ SOAJ injection 0.3 mg as needed for anaphylaxis.    [provider]  ipratropium (ATROVENT) 0.06 % nasal spray Place 2 sprays into both nostrils 4 (four) times daily. 03/07/21   Becky Augusta, NP  levocetirizine Elita Boone ALLERGY 24HR) 5 MG tablet Take 1 tablet (5 mg total) by mouth every evening. 01/19/22   Coralyn Mark, NP  metroNIDAZOLE (FLAGYL) 500 MG tablet Take 1 tablet (500 mg total) by mouth 2 (two) times daily. 07/20/22   Fermina Mishkin, Seward Meth, DO  predniSONE (DELTASONE) 10 MG tablet Take 2 tablets (20 mg total) by mouth daily. 01/19/22   Coralyn Mark, NP  promethazine-dextromethorphan (PROMETHAZINE-DM) 6.25-15 MG/5ML syrup Take 5 mLs by mouth 4 (four) times daily as needed. 07/20/21   Becky Augusta, NP  Spacer/Aero-Holding Chambers (AEROCHAMBER MV) inhaler Use as  instructed 07/20/21   Margarette Canada, NP  etonogestrel (NEXPLANON) 68 MG IMPL implant 1 each by Subdermal route once.  03/07/21  [provider]    Family History Family History  Problem Relation Age of Onset   Healthy Mother    Healthy Father     Social History Social History   Tobacco Use   Smoking status: Never   Smokeless tobacco: Never  Vaping Use   Vaping Use: Never used  Substance Use Topics   Alcohol use: No   Drug use: No     Allergies   Peanut (diagnostic), Food, and Sulfa antibiotics   Review of Systems Review of Systems: :negative  unless otherwise stated in HPI.      Physical Exam Triage Vital Signs ED Triage Vitals  Enc Vitals Group     BP 08/31/22 1748 (!) 157/103     Pulse Rate 08/31/22 1748 (!) 103     Resp --      Temp 08/31/22 1748 98.9 F (37.2 C)     Temp Source 08/31/22 1748 Oral     SpO2 08/31/22 1748 98 %     Weight 08/31/22 1746 (!) 315 lb (142.9 kg)     Height 08/31/22 1746 5\' 8"  (1.727 m)     Head Circumference --      Peak Flow --      Pain Score --      Pain Loc --      Pain Edu? --      Excl. in [place]? --    No data found.  Updated Vital Signs BP (!) 157/103 (BP Location: Right Arm)   Pulse (!) 103   Temp 98.9 F (37.2 C) (Oral)   Ht 5\' 8"  (1.727 m)   Wt (!) 142.9 kg   LMP 08/26/2022 (Approximate)   SpO2 98%   BMI 47.90 kg/m   Visual Acuity Right Eye Distance:   Left Eye Distance:   Bilateral Distance:    Right Eye Near:   Left Eye Near:    Bilateral Near:     Physical Exam GEN: well appearing female in no acute distress  CVS: well perfused  RESP: speaking in full sentences without pause, no respiratory distress  MSK: Ankle/Foot, left: TTP noted at the calcaneous.  Laceration to her left heel and somewhat star-shaped pattern.  There is some mild swelling no appreciable ecchymoses or bony deformity. No  evidence of tibiotalar  deviation; Range of motion is full  in all directions. Strength is 5/5  in all directions. No  tenderness at the insertion/body/myotendinous junction of the Achilles tendon; No  tenderness on posterior aspects of lateral and medial malleolus; Stable  lateral and medial ligaments; Unremarkable  squeeze test; Talar dome non tender; Calcaneal squeeze not attempted due to overlying laceration; No  tenderness over the navicular prominence; No  tenderness over cuboid; No  pain at base of 5th MT; No  tenderness at the distal metatarsals; antalgic gait   UC Treatments / Results  Labs (all labs ordered are listed, but only abnormal results are displayed) Labs  Reviewed - No data to display  EKG   Radiology DG Foot Complete Left  Result Date: 08/31/2022 CLINICAL DATA:  Pain left heel, recent trauma EXAM: LEFT FOOT - COMPLETE 3+ VIEW COMPARISON:  None Available. FINDINGS: There is no evidence of fracture or dislocation. There is no evidence of arthropathy or other focal bone abnormality. Soft tissues are unremarkable. IMPRESSION: No fracture or dislocation is seen in  left foot. Electronically Signed   By: Ernie Avena M.D.   On: 08/31/2022 18:39    Procedures Procedures (including critical care time)  Medications Ordered in UC Medications - No data to display  Initial Impression / Assessment and Plan / UC Course  I have reviewed the triage vital signs and the nursing notes.  Pertinent labs & imaging results that were available during my care of the patient were reviewed by me and considered in my medical decision making (see chart for details).      Pt is a 21 y.o.  female with 4 days of left heel pain after storm door injury.  On exam she has a starburst kind of laceration to her left heel.  Obtain left foot x-rays which were unremarkable for fracture or dislocation.  Her last tetanus was in June 2018.  The wound will need to heal via secondary intention.  Given Keflex for the access.  Patient to gradually return to normal activities, as tolerated and continue ordinary activities within the limits permitted by pain. Prescribed Naproxen sodium. Tylenol PRN. Advised patient to avoid other NSAIDs while taking Naprosyn. Counseled patient on red flag symptoms and when to seek immediate care.  Given crutches and wrapped in Ace wrapped prior to discharge.  Patient to follow up with orthopedic provider if symptoms do not improve with conservative treatment.  Return and ED precautions given.   Discussed MDM, treatment plan and plan for follow-up with patient/parent who agrees with plan.   Final Clinical Impressions(s) / UC Diagnoses   Final  diagnoses:  Laceration of left heel, initial encounter     Discharge Instructions      Stop by the pharmacy to pick up your antibiotics and pain medication.  Your x-ray did not show any fractures or dislocations.  Your wound will heal with time.  If you notice that the wound is not healing in the next 7 to 10 days, follow-up here at the urgent care or with your primary care doctor.  If you start developing a fever, go to the emergency department.  Your tetanus was last updated in June 2018 and did not need to be updated today.      ED Prescriptions     Medication Sig Dispense Auth. Provider   cephALEXin (KEFLEX) 500 MG capsule Take 1 capsule (500 mg total) by mouth 3 (three) times daily for 7 days. 21 capsule Gladys Gutman, DO   naproxen (NAPROSYN) 500 MG tablet Take 1 tablet (500 mg total) by mouth 2 (two) times daily with a meal. 30 tablet Zoe Creasman, DO      PDMP not reviewed this encounter.   Katha Cabal, DO 09/02/22 1339

## 2022-08-31 NOTE — Discharge Instructions (Addendum)
Stop by the pharmacy to pick up your antibiotics and pain medication.  Your x-ray did not show any fractures or dislocations.  Your wound will heal with time.  If you notice that the wound is not healing in the next 7 to 10 days, follow-up here at the urgent care or with your primary care doctor.  If you start developing a fever, go to the emergency department.  Your tetanus was last updated in June 2018 and did not need to be updated today.

## 2022-08-31 NOTE — ED Triage Notes (Addendum)
Pt states Saturday morning door hit her back of LT heel DOI: 08/27/22

## 2022-12-21 ENCOUNTER — Encounter: Payer: Self-pay | Admitting: Emergency Medicine

## 2022-12-21 ENCOUNTER — Ambulatory Visit
Admission: EM | Admit: 2022-12-21 | Discharge: 2022-12-21 | Disposition: A | Discharge status: Home / Self Care | Payer: 59 | Attending: Family Medicine | Admitting: Family Medicine

## 2022-12-21 DIAGNOSIS — M25551 Pain in right hip: Secondary | ICD-10-CM | POA: Diagnosis present

## 2022-12-21 DIAGNOSIS — R1031 Right lower quadrant pain: Secondary | ICD-10-CM | POA: Insufficient documentation

## 2022-12-21 LAB — URINALYSIS, W/ REFLEX TO CULTURE (INFECTION SUSPECTED)
Bilirubin Urine: NEGATIVE
Glucose, UA: NEGATIVE mg/dL
Hgb urine dipstick: NEGATIVE
Ketones, ur: NEGATIVE mg/dL
Leukocytes,Ua: NEGATIVE
Nitrite: NEGATIVE
Protein, ur: NEGATIVE mg/dL
Specific Gravity, Urine: 1.02 (ref 1.005–1.030)
pH: 5.5 (ref 5.0–8.0)

## 2022-12-21 LAB — CBC WITH DIFFERENTIAL/PLATELET
Abs Immature Granulocytes: 0.05 10*3/uL (ref 0.00–0.07)
Basophils Absolute: 0 10*3/uL (ref 0.0–0.1)
Basophils Relative: 0 %
Eosinophils Absolute: 0.1 10*3/uL (ref 0.0–0.5)
Eosinophils Relative: 1 %
HCT: 44.6 % (ref 36.0–46.0)
Hemoglobin: 15.1 g/dL — ABNORMAL HIGH (ref 12.0–15.0)
Immature Granulocytes: 1 %
Lymphocytes Relative: 22 %
Lymphs Abs: 2.4 10*3/uL (ref 0.7–4.0)
MCH: 29.3 pg (ref 26.0–34.0)
MCHC: 33.9 g/dL (ref 30.0–36.0)
MCV: 86.6 fL (ref 80.0–100.0)
Monocytes Absolute: 0.4 10*3/uL (ref 0.1–1.0)
Monocytes Relative: 4 %
Neutro Abs: 8 10*3/uL — ABNORMAL HIGH (ref 1.7–7.7)
Neutrophils Relative %: 72 %
Platelets: 242 10*3/uL (ref 150–400)
RBC: 5.15 MIL/uL — ABNORMAL HIGH (ref 3.87–5.11)
RDW: 13.5 % (ref 11.5–15.5)
WBC: 11 10*3/uL — ABNORMAL HIGH (ref 4.0–10.5)
nRBC: 0 % (ref 0.0–0.2)

## 2022-12-21 LAB — COMPREHENSIVE METABOLIC PANEL
ALT: 39 U/L (ref 0–44)
AST: 36 U/L (ref 15–41)
Albumin: 4 g/dL (ref 3.5–5.0)
Alkaline Phosphatase: 62 U/L (ref 38–126)
Anion gap: 9 (ref 5–15)
BUN: 16 mg/dL (ref 6–20)
CO2: 21 mmol/L — ABNORMAL LOW (ref 22–32)
Calcium: 8.7 mg/dL — ABNORMAL LOW (ref 8.9–10.3)
Chloride: 103 mmol/L (ref 98–111)
Creatinine, Ser: 0.87 mg/dL (ref 0.44–1.00)
GFR, Estimated: >60 mL/min (ref >60)
Glucose, Bld: 90 mg/dL (ref 70–99)
Potassium: 3.8 mmol/L (ref 3.5–5.1)
Sodium: 133 mmol/L — ABNORMAL LOW (ref 135–145)
Total Bilirubin: 0.3 mg/dL (ref 0.3–1.2)
Total Protein: 8.2 g/dL — ABNORMAL HIGH (ref 6.5–8.1)

## 2022-12-21 MED ORDER — NAPROXEN 500 MG PO TABS
500.0000 mg | ORAL_TABLET | Freq: Two times a day (BID) | ORAL | 0 refills | Status: DC
Start: 2022-12-21 — End: 2023-12-07

## 2022-12-21 MED ORDER — CYCLOBENZAPRINE HCL 10 MG PO TABS: 10.0000 mg | ORAL_TABLET | Freq: Three times a day (TID) | ORAL | 0 refills | Status: DC | PRN

## 2022-12-21 NOTE — ED Provider Notes (Signed)
[facility]    CSN: [id number] Arrival date & time: 12/21/22  0913      History   Chief Complaint Chief Complaint  Patient presents with   Hip Pain   Back Pain    HPI  HPI Amy Conley is a 22 y.o. female.   Amy Conley presents for right sided hip pain that radiates to her pelvis that started yesterday around lunch-time. It felt like a period cramp with annoying pain. Pain wakes her up from sleep. This morning, she took Ibuprofen. 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 Denies fever, chills, urinary frequency or urgency, dysuria, nausea or vomiting.   She had diarrhea a couple days ago after eating food from a new restaurant but since then she has had normal bowel movements.   low back pain that started *** days ago after ***?injury. Pain is described as *** {back pain desc:31852} and {back pain radiation:11559}. Pain rated ***/10.  Endorses {ed back pain sx:10963}. Denies {ed back pain sx:10963}.   Continues to have *** description pain with movement. Lavella ***does not feel like Amy Conley*** legs are weak.   Has ***never injured Amy Conley/her*** back before.   Has tried {home care:60200} with {relief:12621}.  ***No change in pain day vs night.    Red flags*** Perianal numbness, cancer, unexplained weight loss, immunosuppression, prolonged use of steroids, history of IV drug use, pain that is increased or unrelieved by rest, fever, bladder or bowel incontinence, significant trauma related to age   Fever : no  Weight loss: *** Perianal numbness: *** Bowel incontinence: *** Bladder incontinence: *** Trauma: ***  Sore throat: no   Cough: no Hydration: normal  Abdominal pain: no Nausea: no Vomiting: no Abdominal pain: *** Dysuria:*** Sleep disturbance: no *** Neck Pain: no Headache: no     Past Medical History:  Diagnosis Date   Allergy    SEASONAL ALLERGIES-USES ALBUTEROL INHALER PRN   Headache    MIGRAINES-TAKES PHENERGAN AND ZOFRAN    Obesity     Patient Active Problem List   Diagnosis Date Noted   Viral URI with cough 10/13/2021   Acute suppurative otitis media of left ear without spontaneous rupture of tympanic membrane 10/13/2021   Urticaria 10/16/2015    Past Surgical History:  Procedure Laterality Date   TONSILLECTOMY AND ADENOIDECTOMY Bilateral 10/03/2016   Procedure: TONSILLECTOMY AND ADENOIDECTOMY;  Surgeon: Margaretha Sheffield, MD;  Location: [facility];  Service: ENT;  Laterality: Bilateral;    OB History   No obstetric history on file.      Home Medications    Prior to Admission medications   Medication Sig Start Date End Date Taking? Authorizing Provider  acetaminophen (TYLENOL) 325 MG tablet Take by mouth.    [provider]  albuterol (VENTOLIN HFA) 108 (90 Base) MCG/ACT inhaler Inhale 1-2 puffs into the lungs every 6 (six) hours as needed for wheezing or shortness of breath. 61/44/31   Defelice, Jeanett Schlein, NP  APRI 0.15-30 MG-MCG tablet Take 1 tablet by mouth daily. 03/13/20   [provider]  benzonatate (TESSALON) 100 MG capsule Take 2 capsules (200 mg total) by mouth every 8 (eight) hours. 01/19/22   Marney Setting, NP  diphenhydrAMINE (BENADRYL) 25 mg capsule Take by mouth.    [provider]  EPINEPHrine 0.3 mg/0.3 mL IJ SOAJ injection 0.3 mg as needed for anaphylaxis.    [provider]  ipratropium (ATROVENT) 0.06 % nasal spray Place 2 sprays into both nostrils 4 (four)  times daily. 03/07/21   Margarette Canada, NP  levocetirizine Harlow Ohms ALLERGY 24HR) 5 MG tablet Take 1 tablet (5 mg total) by mouth every evening. 01/19/22   Marney Setting, NP  metroNIDAZOLE (FLAGYL) 500 MG tablet Take 1 tablet (500 mg total) by mouth 2 (two) times daily. 07/20/22   Lyndee Hensen, DO  naproxen (NAPROSYN) 500 MG tablet Take 1 tablet (500 mg total) by mouth 2 (two) times daily with a meal. 08/31/22   Rubert Frediani, DO  norethindrone-ethinyl estradiol-iron (ESTROSTEP FE,TILIA  FE,TRI-LEGEST FE) 1-20/1-30/1-35 MG-MCG tablet Take 1 tablet by mouth daily.    [provider]  predniSONE (DELTASONE) 10 MG tablet Take 2 tablets (20 mg total) by mouth daily. 01/19/22   Marney Setting, NP  promethazine-dextromethorphan (PROMETHAZINE-DM) 6.25-15 MG/5ML syrup Take 5 mLs by mouth 4 (four) times daily as needed. 07/20/21   Margarette Canada, NP  Spacer/Aero-Holding Josiah Lobo (AEROCHAMBER MV) inhaler Use as instructed 07/20/21   Margarette Canada, NP  etonogestrel (NEXPLANON) 68 MG IMPL implant 1 each by Subdermal route once.  03/07/21  [provider]    Family History Family History  Problem Relation Age of Onset   Healthy Mother    Healthy Father     Social History Social History   Tobacco Use   Smoking status: Never   Smokeless tobacco: Never  Vaping Use   Vaping Use: Never used  Substance Use Topics   Alcohol use: No   Drug use: No     Allergies   Peanut (diagnostic), Food, and Sulfa antibiotics   Review of Systems Review of Systems: egative unless otherwise stated in HPI.      Physical Exam Triage Vital Signs ED Triage Vitals  Enc Vitals Group     BP 12/21/22 0928 (!) 137/95     Pulse Rate 12/21/22 0928 (!) 101     Resp 12/21/22 0928 17     Temp 12/21/22 0928 99.1 F (37.3 C)     Temp Source 12/21/22 0928 Oral     SpO2 12/21/22 0928 96 %     Weight --      Height --      Head Circumference --      Peak Flow --      Pain Score 12/21/22 0927 5     Pain Loc --      Pain Edu? --      Excl. in [place]? --    No data found.  Updated Vital Signs BP (!) 137/95 (BP Location: Left Arm)   Pulse (!) 101   Temp 99.1 F (37.3 C) (Oral)   Resp 17   SpO2 96%   Visual Acuity Right Eye Distance:   Left Eye Distance:   Bilateral Distance:    Right Eye Near:   Left Eye Near:    Bilateral Near:     Physical Exam GEN: well appearing female in no acute distress  CVS: well perfused  RESP: speaking in full sentences without pause, no  respiratory distress  MSK:  Lumbar spine: - Inspection: no gross deformity or asymmetry, swelling or ecchymosis. No skin changes  - Palpation: No TTP over the spinous processes, ***bilateral lumbar paraspinal muscles, no SI joint tenderness bilaterally - ROM: full active ROM of the lumbar spine in flexion and extension but with mild pain ***with flexion/extension  - Strength: 5/5 strength of lower extremity in L4-S1 nerve root distributions b/l - Neuro: sensation intact in the L4-S1 nerve root distribution b/l, ***2+ L4 and S1  reflexes - Special testing: Negative straight leg raise SKIN: warm, dry, no overly skin rash or erythema    UC Treatments / Results  Labs (all labs ordered are listed, but only abnormal results are displayed) Labs Reviewed - No data to display  EKG   Radiology No results found.  Procedures Procedures (including critical care time)  Medications Ordered in UC Medications - No data to display  Initial Impression / Assessment and Plan / UC Course  I have reviewed the triage vital signs and the nursing notes.  Pertinent labs & imaging results that were available during my care of the patient were reviewed by me and considered in my medical decision making (see chart for details).      Pt is a 22 y.o.  female with *** days of *** back pain after ***.  Obtained *** plain films.  Exam is concerning for a ***.   Xray personally reviewed by me were ***unremarkable for fracture, ***malalignment or dislocation.  Radiologist agrees***/notes ***  Patient to gradually return to normal activities, as tolerated and continue ordinary activities within the limits permitted by pain. Prescribed Naproxen sodium *** and muscle relaxer *** for pain relief.  Advised patient to avoid other NSAIDs while taking ***Naprosyn. Tylenol and Lidocaine patches PRN for multimodal pain relief. Counseled patient on red flag symptoms and when to seek immediate care.  ***No red flags suggesting  cauda equina syndrome or progressive major motor weakness. Patient to follow up with orthopedic provider if symptoms do not improve with conservative treatment.  Return and ED precautions given.    Discussed MDM, treatment plan and plan for follow-up with patient/parent who agrees with plan.   Final Clinical Impressions(s) / UC Diagnoses   Final diagnoses:  None   Discharge Instructions   None    ED Prescriptions   None    PDMP not reviewed this encounter.

## 2022-12-21 NOTE — Discharge Instructions (Addendum)
Your urine sample did not show cause of your pain. You had elevation in some of your blood cells however I suspect this may be due to mild dehydration vs a viral illness. Be sure to stay hydrated.   I suspect muscular strain. You were prescribed muscle relaxers and pain medication for this.   Stop by the pharmacy to pick up your prescriptions.  If you have worsening pain or pain persisting more than 1 week you should be evaluated in the emergency department.

## 2022-12-21 NOTE — ED Triage Notes (Signed)
Pt reports right hip pain and mid back that started yesterday around noon. States pain felt like a cramping sensation and worsens last night and this morning. Rates pain 5/10

## 2022-12-22 NOTE — ED Provider Notes (Incomplete)
[facility]    CSN: [id number] Arrival date & time: 12/21/22  0913      History   Chief Complaint Chief Complaint  Patient presents with  . Hip Pain  . Back Pain    HPI  HPI Amy Conley is a 22 y.o. female.   Amy Conley presents for right sided flank pain that radiates to her pelvis that started yesterday around lunch-time. It felt like a period cramp with annoying pain. Pain wakes her up from sleep. This morning, she took Ibuprofen. 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 Denies fever, chills, urinary frequency or urgency, dysuria, nausea or vomiting.   She had diarrhea a couple days ago after eating food from a new restaurant but since then she has had normal bowel movements.     Past Medical History:  Diagnosis Date  . Allergy    SEASONAL ALLERGIES-USES ALBUTEROL INHALER PRN  . Headache    MIGRAINES-TAKES PHENERGAN AND ZOFRAN  . Obesity     Patient Active Problem List   Diagnosis Date Noted  . Viral URI with cough 10/13/2021  . Acute suppurative otitis media of left ear without spontaneous rupture of tympanic membrane 10/13/2021  . Urticaria 10/16/2015    Past Surgical History:  Procedure Laterality Date  . TONSILLECTOMY AND ADENOIDECTOMY Bilateral 10/03/2016   Procedure: TONSILLECTOMY AND ADENOIDECTOMY;  Surgeon: Margaretha Sheffield, MD;  Location: [facility];  Service: ENT;  Laterality: Bilateral;    OB History   No obstetric history on file.      Home Medications    Prior to Admission medications   Medication Sig Start Date End Date Taking? Authorizing Provider  acetaminophen (TYLENOL) 325 MG tablet Take by mouth.    [provider]  albuterol (VENTOLIN HFA) 108 (90 Base) MCG/ACT inhaler Inhale 1-2 puffs into the lungs every 6 (six) hours as needed for wheezing or shortness of breath. 35/70/17   Defelice, Jeanett Schlein, NP  APRI 0.15-30 MG-MCG tablet Take 1 tablet by mouth daily. 03/13/20   [provider]   benzonatate (TESSALON) 100 MG capsule Take 2 capsules (200 mg total) by mouth every 8 (eight) hours. 01/19/22   Marney Setting, NP  diphenhydrAMINE (BENADRYL) 25 mg capsule Take by mouth.    [provider]  EPINEPHrine 0.3 mg/0.3 mL IJ SOAJ injection 0.3 mg as needed for anaphylaxis.    [provider]  ipratropium (ATROVENT) 0.06 % nasal spray Place 2 sprays into both nostrils 4 (four) times daily. 03/07/21   Margarette Canada, NP  levocetirizine Harlow Ohms ALLERGY 24HR) 5 MG tablet Take 1 tablet (5 mg total) by mouth every evening. 01/19/22   Marney Setting, NP  metroNIDAZOLE (FLAGYL) 500 MG tablet Take 1 tablet (500 mg total) by mouth 2 (two) times daily. 07/20/22   Lyndee Hensen, DO  naproxen (NAPROSYN) 500 MG tablet Take 1 tablet (500 mg total) by mouth 2 (two) times daily with a meal. 08/31/22   Hillis Mcphatter, DO  norethindrone-ethinyl estradiol-iron (ESTROSTEP FE,TILIA FE,TRI-LEGEST FE) 1-20/1-30/1-35 MG-MCG tablet Take 1 tablet by mouth daily.    [provider]  predniSONE (DELTASONE) 10 MG tablet Take 2 tablets (20 mg total) by mouth daily. 01/19/22   Marney Setting, NP  promethazine-dextromethorphan (PROMETHAZINE-DM) 6.25-15 MG/5ML syrup Take 5 mLs by mouth 4 (four) times daily as needed. 07/20/21   Margarette Canada, NP  Spacer/Aero-Holding Chambers (AEROCHAMBER MV) inhaler Use as instructed 07/20/21   Margarette Canada, NP  etonogestrel (NEXPLANON) 79 MG  IMPL implant 1 each by Subdermal route once.  03/07/21  [provider]    Family History Family History  Problem Relation Age of Onset  . Healthy Mother   . Healthy Father     Social History Social History   Tobacco Use  . Smoking status: Never  . Smokeless tobacco: Never  Vaping Use  . Vaping Use: Never used  Substance Use Topics  . Alcohol use: No  . Drug use: No     Allergies   Peanut (diagnostic), Food, and Sulfa antibiotics   Review of Systems Review of Systems: egative unless  otherwise stated in HPI.      Physical Exam Triage Vital Signs ED Triage Vitals  Enc Vitals Group     BP 12/21/22 0928 (!) 137/95     Pulse Rate 12/21/22 0928 (!) 101     Resp 12/21/22 0928 17     Temp 12/21/22 0928 99.1 F (37.3 C)     Temp Source 12/21/22 0928 Oral     SpO2 12/21/22 0928 96 %     Weight --      Height --      Head Circumference --      Peak Flow --      Pain Score 12/21/22 0927 5     Pain Loc --      Pain Edu? --      Excl. in [place]? --    No data found.  Updated Vital Signs BP (!) 137/95 (BP Location: Left Arm)   Pulse (!) 101   Temp 99.1 F (37.3 C) (Oral)   Resp 17   SpO2 96%   Visual Acuity Right Eye Distance:   Left Eye Distance:   Bilateral Distance:    Right Eye Near:   Left Eye Near:    Bilateral Near:     Physical Exam GEN: well appearing female in no acute distress  CVS: well perfused  RESP: speaking in full sentences without pause, no respiratory distress  MSK:  Lumbar spine: - Inspection: no gross deformity or asymmetry, swelling or ecchymosis. No skin changes  - Palpation: No TTP over the spinous processes, ***bilateral lumbar paraspinal muscles, no SI joint tenderness bilaterally - ROM: full active ROM of the lumbar spine in flexion and extension but with mild pain ***with flexion/extension  - Strength: 5/5 strength of lower extremity in L4-S1 nerve root distributions b/l - Neuro: sensation intact in the L4-S1 nerve root distribution b/l, ***2+ L4 and S1 reflexes - Special testing: Negative straight leg raise SKIN: warm, dry, no overly skin rash or erythema    UC Treatments / Results  Labs (all labs ordered are listed, but only abnormal results are displayed) Labs Reviewed - No data to display  EKG   Radiology No results found.  Procedures Procedures (including critical care time)  Medications Ordered in UC Medications - No data to display  Initial Impression / Assessment and Plan / UC Course  I have reviewed  the triage vital signs and the nursing notes.  Pertinent labs & imaging results that were available during my care of the patient were reviewed by me and considered in my medical decision making (see chart for details).      Pt is a 22 y.o.  female with *** days of *** back pain after ***.  Obtained *** plain films.  Exam is concerning for a ***.   Xray personally reviewed by me were ***unremarkable for fracture, ***malalignment or dislocation.  Radiologist  agrees***/notes ***  Patient to gradually return to normal activities, as tolerated and continue ordinary activities within the limits permitted by pain. Prescribed Naproxen sodium *** and muscle relaxer *** for pain relief.  Advised patient to avoid other NSAIDs while taking ***Naprosyn. Tylenol and Lidocaine patches PRN for multimodal pain relief. Counseled patient on red flag symptoms and when to seek immediate care.  ***No red flags suggesting cauda equina syndrome or progressive major motor weakness. Patient to follow up with orthopedic provider if symptoms do not improve with conservative treatment.  Return and ED precautions given.    Discussed MDM, treatment plan and plan for follow-up with patient/parent who agrees with plan.   Final Clinical Impressions(s) / UC Diagnoses   Final diagnoses:  None   Discharge Instructions   None    ED Prescriptions   None    PDMP not reviewed this encounter.

## 2023-07-27 ENCOUNTER — Ambulatory Visit
Admission: EM | Admit: 2023-07-27 | Discharge: 2023-07-27 | Disposition: A | Discharge status: Home / Self Care | Payer: Managed Care, Other (non HMO) | Attending: Emergency Medicine | Admitting: Emergency Medicine

## 2023-07-27 DIAGNOSIS — U071 COVID-19: Secondary | ICD-10-CM | POA: Insufficient documentation

## 2023-07-27 LAB — SARS CORONAVIRUS 2 BY RT PCR: SARS Coronavirus 2 by RT PCR: POSITIVE — AB

## 2023-07-27 LAB — GROUP A STREP BY PCR: Group A Strep by PCR: NOT DETECTED

## 2023-07-27 LAB — GLUCOSE, CAPILLARY: Glucose-Capillary: 108 mg/dL — ABNORMAL HIGH (ref 70–99)

## 2023-07-27 MED ORDER — ACETAMINOPHEN 325 MG PO TABS
975.0000 mg | ORAL_TABLET | Freq: Once | ORAL | Status: AC
  Administered 2023-07-27: 975 mg via ORAL

## 2023-07-27 MED ORDER — ONDANSETRON HCL 4 MG PO TABS
4.0000 mg | ORAL_TABLET | Freq: Four times a day (QID) | ORAL | 0 refills | Status: AC
Start: 2023-07-27 — End: ?

## 2023-07-27 MED ORDER — IPRATROPIUM BROMIDE 0.06 % NA SOLN: 2.0000 | Freq: Four times a day (QID) | NASAL | 12 refills | Status: DC

## 2023-07-27 MED ORDER — PROMETHAZINE-DM 6.25-15 MG/5ML PO SYRP: 5.0000 mL | ORAL_SOLUTION | Freq: Four times a day (QID) | ORAL | 0 refills | Status: DC | PRN

## 2023-07-27 MED ORDER — IBUPROFEN 800 MG PO TABS: 800.0000 mg | ORAL_TABLET | Freq: Once | ORAL | Status: DC

## 2023-07-27 MED ORDER — BENZONATATE 100 MG PO CAPS: 200.0000 mg | ORAL_CAPSULE | Freq: Three times a day (TID) | ORAL | 0 refills | Status: DC

## 2023-07-27 NOTE — Discharge Instructions (Addendum)
CDC guidelines state that you must wear a mask for the first 5 days of symptoms when you are around other people.  After 5 days you no longer need to mask as you are no longer considered infectious.  There is no longer need to quarantine unless you have a fever.  If you do have a fever then you need to quarantine until you have been fever free for 24 hours without taking Tylenol and/or ibuprofen.  Use over-the-counter Tylenol and/or ibuprofen according to the package instructions as needed for fever and pain.  Use the Atrovent nasal spray, 2 squirts up each nostril every 6 hours, as needed for nasal congestion and runny nose.  Use the Tessalon Perles every 8 hours during the day as needed for cough.  Take them with a small sip of water.  You may experience numbness to the base of your tongue or metallic taste in her mouth, this is normal.  Use the Promethazine DM cough syrup at bedtime as needed for cough and congestion.  Be mindful this medication will make you sleepy.  Take the Zofran every 6 hours as needed for nausea.  If you develop any worsening respiratory symptoms such as shortness of breath, shortness of breath at rest, feel as though you cannot catch your breath, you are unable to speak in full sentences, or, as a late sign, your lips begin turning blue you need to call 911 and go to the ER for evaluation.

## 2023-07-27 NOTE — ED Triage Notes (Addendum)
Pt is with her dad  Pt c/o body aches, temperature of 101.6, sore throat x2days  Pt asks for testing.  Pt took tylenol this morning around 5:15am

## 2023-07-27 NOTE — ED Provider Notes (Signed)
[facility]    CSN: [id number] Arrival date & time: 07/27/23  1146      History   Chief Complaint Chief Complaint  Patient presents with   Generalized Body Aches   Sore Throat          HPI Amy Conley is a 22 y.o. female.   HPI  22 year old female with a past medical history significant for allergies and obesity presents for evaluation of COVID-like symptoms which began last night.  Patient reports fever with a Tmax of 1-1.6, runny nose, nasal congestion, ear pain, sore throat, body aches, nonproductive cough, wheezing, nausea, decreased appetite, and headache.  She denies any shortness of breath, vomiting, or diarrhea.  No known sick contacts or recent travel.  Past Medical History:  Diagnosis Date   Allergy    SEASONAL ALLERGIES-USES ALBUTEROL INHALER PRN   Headache    MIGRAINES-TAKES PHENERGAN AND ZOFRAN   Obesity     Patient Active Problem List   Diagnosis Date Noted   Viral URI with cough 10/13/2021   Acute suppurative otitis media of left ear without spontaneous rupture of tympanic membrane 10/13/2021   Urticaria 10/16/2015    Past Surgical History:  Procedure Laterality Date   TONSILLECTOMY AND ADENOIDECTOMY Bilateral 10/03/2016   Procedure: TONSILLECTOMY AND ADENOIDECTOMY;  Surgeon: Vernie Murders, MD;  Location: [facility];  Service: ENT;  Laterality: Bilateral;    OB History   No obstetric history on file.      Home Medications    Prior to Admission medications   Medication Sig Start Date End Date Taking? Authorizing Provider  acetaminophen (TYLENOL) 325 MG tablet Take by mouth.   Yes [provider]  albuterol (VENTOLIN HFA) 108 (90 Base) MCG/ACT inhaler Inhale 1-2 puffs into the lungs every 6 (six) hours as needed for wheezing or shortness of breath. 10/13/21  Yes Defelice, Para March, NP  APRI 0.15-30 MG-MCG tablet Take 1 tablet by mouth daily. 03/13/20  Yes [provider]  benzonatate (TESSALON) 100 MG capsule Take  2 capsules (200 mg total) by mouth every 8 (eight) hours. 07/27/23  Yes Becky Augusta, NP  ipratropium (ATROVENT) 0.06 % nasal spray Place 2 sprays into both nostrils 4 (four) times daily. 07/27/23  Yes Becky Augusta, NP  ondansetron (ZOFRAN) 4 MG tablet Take 1 tablet (4 mg total) by mouth every 6 (six) hours. 07/27/23  Yes Becky Augusta, NP  promethazine-dextromethorphan (PROMETHAZINE-DM) 6.25-15 MG/5ML syrup Take 5 mLs by mouth 4 (four) times daily as needed. 07/27/23  Yes Becky Augusta, NP  naproxen (NAPROSYN) 500 MG tablet Take 1 tablet (500 mg total) by mouth 2 (two) times daily with a meal. 12/21/22   Brimage, Vondra, DO  etonogestrel (NEXPLANON) 68 MG IMPL implant 1 each by Subdermal route once.  03/07/21  [provider]    Family History Family History  Problem Relation Age of Onset   Healthy Mother    Healthy Father     Social History Social History   Tobacco Use   Smoking status: Never   Smokeless tobacco: Never  Vaping Use   Vaping status: Never Used  Substance Use Topics   Alcohol use: No   Drug use: No     Allergies   Peanut (diagnostic), Food, Metformin and related, and Sulfa antibiotics   Review of Systems Review of Systems  Constitutional:  Positive for fever.  HENT:  Positive for congestion, ear pain, rhinorrhea and sore throat.   Respiratory:  Positive for cough and wheezing. Negative  for shortness of breath.   Gastrointestinal:  Positive for nausea. Negative for diarrhea and vomiting.  Musculoskeletal:  Positive for arthralgias and myalgias.  Skin:  Negative for rash.  Neurological:  Positive for headaches.     Physical Exam Triage Vital Signs ED Triage Vitals  Encounter Vitals Group     BP      Systolic BP Percentile      Diastolic BP Percentile      Pulse      Resp      Temp      Temp src      SpO2      Weight      Height      Head Circumference      Peak Flow      Pain Score      Pain Loc      Pain Education      Exclude from Growth  Chart    No data found.  Updated Vital Signs BP 123/80 (BP Location: Left Arm)   Pulse (!) 125   Temp (!) 100.7 F (38.2 C) (Oral)   Resp 16   Ht 5\' 8"  (1.727 m)   Wt (!) 313 lb (142 kg)   LMP 06/26/2023   SpO2 96%   BMI 47.59 kg/m   Visual Acuity Right Eye Distance:   Left Eye Distance:   Bilateral Distance:    Right Eye Near:   Left Eye Near:    Bilateral Near:     Physical Exam Vitals and nursing note reviewed.  Constitutional:      Appearance: Normal appearance. She is ill-appearing.  HENT:     Head: Normocephalic and atraumatic.     Right Ear: Tympanic membrane, ear canal and external ear normal. There is no impacted cerumen.     Left Ear: Tympanic membrane, ear canal and external ear normal. There is no impacted cerumen.     Nose: Congestion and rhinorrhea present.     Comments: Nasal mucosa is erythematous and edematous with clear discharge in both nares.    Mouth/Throat:     Mouth: Mucous membranes are moist.     Pharynx: Oropharynx is clear. Posterior oropharyngeal erythema present. No oropharyngeal exudate.     Comments: Tonsillar pillars are surgically absent.  There is erythema to the posterior oropharynx with clear postnasal drip. Cardiovascular:     Rate and Rhythm: Normal rate and regular rhythm.     Pulses: Normal pulses.     Heart sounds: Normal heart sounds. No murmur heard.    No friction rub. No gallop.  Pulmonary:     Effort: Pulmonary effort is normal.     Breath sounds: Normal breath sounds. No wheezing, rhonchi or rales.  Musculoskeletal:     Cervical back: Normal range of motion and neck supple. No tenderness.  Lymphadenopathy:     Cervical: No cervical adenopathy.  Skin:    General: Skin is warm and dry.     Capillary Refill: Capillary refill takes less than 2 seconds.     Findings: No erythema or rash.  Neurological:     General: No focal deficit present.     Mental Status: She is alert and oriented to person, place, and time.       UC Treatments / Results  Labs (all labs ordered are listed, but only abnormal results are displayed) Labs Reviewed  SARS CORONAVIRUS 2 BY RT PCR - Abnormal; Notable for the following components:      Result  Value   SARS Coronavirus 2 by RT PCR POSITIVE (*)    All other components within normal limits  GLUCOSE, CAPILLARY - Abnormal; Notable for the following components:   Glucose-Capillary 108 (*)    All other components within normal limits  GROUP A STREP BY PCR  CBG MONITORING, ED    EKG   Radiology No results found.  Procedures Procedures (including critical care time)  Medications Ordered in UC Medications  ibuprofen (ADVIL) tablet 800 mg (has no administration in time range)  acetaminophen (TYLENOL) tablet 975 mg (975 mg Oral Given 07/27/23 1227)    Initial Impression / Assessment and Plan / UC Course  I have reviewed the triage vital signs and the nursing notes.  Pertinent labs & imaging results that were available during my care of the patient were reviewed by me and considered in my medical decision making (see chart for details).   Patient is a pleasant, though ill-appearing, 22 year old female presenting for evaluation of COVID-like symptoms that began yesterday as outlined HPI above.  She is febrile in clinic with a temperature of 100.4 and tachycardic with a heart rate of 136.  I will order 975 Tylenol for patient's fever and have vital signs reassessed.  On exam she does have inflammation of her upper respiratory tree as well as clear rhinorrhea and clear postnasal drip.  Cardiopulmonary exam is benign.  I will order a COVID PCR and strep PCR.  Patient and family are requesting a blood sugar check as the patient has not eaten all day.  Patient does not have a history of diabetes.  I will order a fingerstick blood sugar and further reassurance.  Fingerstick blood sugar is 108.  Strep PCR is negative.  COVID PCR is positive.  I will discharge patient on  the diagnosis of COVID-19 with prescription for Atrovent nasal spray, Tessalon Perles, Promethazine DM cough syrup.  Zofran to help with nausea.  Final Clinical Impressions(s) / UC Diagnoses   Final diagnoses:  COVID-19     Discharge Instructions      CDC guidelines state that you must wear a mask for the first 5 days of symptoms when you are around other people.  After 5 days you no longer need to mask as you are no longer considered infectious.  There is no longer need to quarantine unless you have a fever.  If you do have a fever then you need to quarantine until you have been fever free for 24 hours without taking Tylenol and/or ibuprofen.  Use over-the-counter Tylenol and/or ibuprofen according to the package instructions as needed for fever and pain.  Use the Atrovent nasal spray, 2 squirts up each nostril every 6 hours, as needed for nasal congestion and runny nose.  Use the Tessalon Perles every 8 hours during the day as needed for cough.  Take them with a small sip of water.  You may experience numbness to the base of your tongue or metallic taste in her mouth, this is normal.  Use the Promethazine DM cough syrup at bedtime as needed for cough and congestion.  Be mindful this medication will make you sleepy.  Take the Zofran every 6 hours as needed for nausea.  If you develop any worsening respiratory symptoms such as shortness of breath, shortness of breath at rest, feel as though you cannot catch your breath, you are unable to speak in full sentences, or, as a late sign, your lips begin turning blue you need to call 911 and  go to the ER for evaluation.      ED Prescriptions     Medication Sig Dispense Auth. Provider   benzonatate (TESSALON) 100 MG capsule Take 2 capsules (200 mg total) by mouth every 8 (eight) hours. 21 capsule Becky Augusta, NP   ipratropium (ATROVENT) 0.06 % nasal spray Place 2 sprays into both nostrils 4 (four) times daily. 15 mL Becky Augusta, NP    promethazine-dextromethorphan (PROMETHAZINE-DM) 6.25-15 MG/5ML syrup Take 5 mLs by mouth 4 (four) times daily as needed. 118 mL Becky Augusta, NP   ondansetron (ZOFRAN) 4 MG tablet Take 1 tablet (4 mg total) by mouth every 6 (six) hours. 12 tablet Becky Augusta, NP      PDMP not reviewed this encounter.   Becky Augusta, NP 07/27/23 1354

## 2023-12-07 ENCOUNTER — Ambulatory Visit
Admission: EM | Admit: 2023-12-07 | Discharge: 2023-12-07 | Disposition: A | Discharge status: Home / Self Care | Payer: Managed Care, Other (non HMO) | Attending: Physician Assistant | Admitting: Physician Assistant

## 2023-12-07 DIAGNOSIS — S161XXA Strain of muscle, fascia and tendon at neck level, initial encounter: Secondary | ICD-10-CM | POA: Diagnosis not present

## 2023-12-07 DIAGNOSIS — R030 Elevated blood-pressure reading, without diagnosis of hypertension: Secondary | ICD-10-CM

## 2023-12-07 DIAGNOSIS — M542 Cervicalgia: Secondary | ICD-10-CM

## 2023-12-07 MED ORDER — BACLOFEN 10 MG PO TABS: 10.0000 mg | ORAL_TABLET | Freq: Three times a day (TID) | ORAL | 0 refills | Status: AC | PRN

## 2023-12-07 NOTE — ED Provider Notes (Signed)
[facility]    CSN: [id number] Arrival date & time: 12/07/23  1556      History   Chief Complaint No chief complaint on file.   HPI Amy Conley is a 23 y.o. female presenting for pain of the posterior neck for the past couple of days.  Denies injury.  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  Reports increased pain on the left side of her neck compared to the right and more pain when she turns toward the left side.  States that she feels hard knots on the back of her neck.  She has taken one 800 mg tablet of ibuprofen for pain today and says it seemed to help a little.  Denies any pain radiating into upper extremities, numbness, tingling, weakness.  No back pain.  No history of similar problems.  No fever, cough, congestion or swollen lymph nodes.  No other complaints.  HPI  Past Medical History:  Diagnosis Date   Allergy    SEASONAL ALLERGIES-USES ALBUTEROL INHALER PRN   Headache    MIGRAINES-TAKES PHENERGAN AND ZOFRAN   Obesity     Patient Active Problem List   Diagnosis Date Noted   Viral URI with cough 10/13/2021   Acute suppurative otitis media of left ear without spontaneous rupture of tympanic membrane 10/13/2021   Urticaria 10/16/2015    Past Surgical History:  Procedure Laterality Date   TONSILLECTOMY AND ADENOIDECTOMY Bilateral 10/03/2016   Procedure: TONSILLECTOMY AND ADENOIDECTOMY;  Surgeon: Vernie Murders, MD;  Location: [facility];  Service: ENT;  Laterality: Bilateral;    OB History   No obstetric history on file.      Home Medications    Prior to Admission medications   Medication Sig Start Date End Date Taking? Authorizing Provider  baclofen (LIORESAL) 10 MG tablet Take 1 tablet (10 mg total) by mouth 3 (three) times daily as needed for muscle spasms. 12/07/23  Yes Shirlee Latch, PA-C  acetaminophen (TYLENOL) 325 MG tablet Take by mouth.    [provider]  albuterol (VENTOLIN HFA) 108 (90 Base)  MCG/ACT inhaler Inhale 1-2 puffs into the lungs every 6 (six) hours as needed for wheezing or shortness of breath. 10/13/21   Defelice, Para March, NP  APRI 0.15-30 MG-MCG tablet Take 1 tablet by mouth daily. 03/13/20   [provider]  ondansetron (ZOFRAN) 4 MG tablet Take 1 tablet (4 mg total) by mouth every 6 (six) hours. 07/27/23   Becky Augusta, NP  etonogestrel (NEXPLANON) 68 MG IMPL implant 1 each by Subdermal route once.  03/07/21  [provider]    Family History Family History  Problem Relation Age of Onset   Healthy Mother    Healthy Father     Social History Social History   Tobacco Use   Smoking status: Never   Smokeless tobacco: Never  Vaping Use   Vaping status: Never Used  Substance Use Topics   Alcohol use: No   Drug use: No     Allergies   Peanut (diagnostic), Food, Metformin and related, and Sulfa antibiotics   Review of Systems Review of Systems  Constitutional:  Negative for chills, diaphoresis, fatigue and fever.  HENT:  Negative for congestion, ear pain and sore throat.   Respiratory:  Negative for cough.   Musculoskeletal:  Positive for neck pain and neck stiffness. Negative for arthralgias and myalgias.  Skin:  Negative for color change and rash.  Neurological:  Negative for dizziness, weakness and headaches.  Hematological:  Negative for adenopathy.     Physical Exam Triage Vital Signs ED Triage Vitals  Encounter Vitals Group     BP 12/07/23 1617 (!) 171/91     Systolic BP Percentile --      Diastolic BP Percentile --      Pulse Rate 12/07/23 1617 90     Resp --      Temp 12/07/23 1617 98.9 F (37.2 C)     Temp Source 12/07/23 1617 Oral     SpO2 12/07/23 1617 97 %     Weight 12/07/23 1616 (!) 315 lb (142.9 kg)     Height 12/07/23 1616 5\' 8"  (1.727 m)     Head Circumference --      Peak Flow --      Pain Score 12/07/23 1615 7     Pain Loc --      Pain Education --      Exclude from Growth Chart --    No data  found.  Updated Vital Signs BP (!) 171/91 (BP Location: Left Arm)   Pulse 90   Temp 98.9 F (37.2 C) (Oral)   Ht 5\' 8"  (1.727 m)   Wt (!) 315 lb (142.9 kg)   LMP 11/06/2023   SpO2 97%   BMI 47.90 kg/m      Physical Exam Vitals and nursing note reviewed.  Constitutional:      General: She is not in acute distress.    Appearance: Normal appearance. She is not ill-appearing or toxic-appearing.  HENT:     Head: Normocephalic and atraumatic.     Nose: Nose normal.     Mouth/Throat:     Mouth: Mucous membranes are moist.     Pharynx: Oropharynx is clear.  Eyes:     General: No scleral icterus.       Right eye: No discharge.        Left eye: No discharge.     Conjunctiva/sclera: Conjunctivae normal.  Cardiovascular:     Rate and Rhythm: Normal rate and regular rhythm.     Heart sounds: Normal heart sounds.  Pulmonary:     Effort: Pulmonary effort is normal. No respiratory distress.     Breath sounds: Normal breath sounds.  Musculoskeletal:     Cervical back: Neck supple. Spasms and tenderness (bilateral paracervical muscles (L>R)) present. No bony tenderness. Pain with movement present. Decreased range of motion.  Skin:    General: Skin is dry.  Neurological:     General: No focal deficit present.     Mental Status: She is alert. Mental status is at baseline.     Motor: No weakness.     Gait: Gait normal.  Psychiatric:        Mood and Affect: Mood normal.        Behavior: Behavior normal.      UC Treatments / Results  Labs (all labs ordered are listed, but only abnormal results are displayed) Labs Reviewed - No data to display  EKG   Radiology No results found.  Procedures Procedures (including critical care time)  Medications Ordered in UC Medications - No data to display  Initial Impression / Assessment and Plan / UC Course  I have reviewed the triage vital signs and the nursing notes.  Pertinent labs & imaging results that were available during my  care of the patient were reviewed by me and considered in my medical decision making (see chart for  details).   23 year old female presents for posterior neck pain bilaterally, worse on the left side, movement pain, knots on the back and neck for the past couple days.  Works a Office manager.  Has taken ibuprofen and has helped a little.  Blood pressure initially elevated at 171/91.  Recheck is 149/102.  No history of hypertension.  Advised to keep a close eye on the blood pressure and if consistently over 140/90 to follow-up with PCP regarding possible medication.  Lifestyle modifications advised.  Clinical presentation consistent with cervical strain and muscle spasms.  Have patient continue ibuprofen, Tylenol, heat, muscle rubs, stretches.  Will add baclofen.  Reviewed making sure her monitor is level and she is careful not to repetitively move her neck.  Reviewed return precautions.   Final Clinical Impressions(s) / UC Diagnoses   Final diagnoses:  Strain of neck muscle, initial encounter  Neck pain  Elevated blood pressure reading     Discharge Instructions      NECK PAIN: Stressed avoiding painful activities. This can exacerbate your symptoms and make them worse.  May apply heat to the areas of pain for some relief. Use medications as directed. Be aware of which medications make you drowsy and do not drive or operate any kind of heavy machinery while using the medication (ie pain medications or muscle relaxers). F/U with PCP for reexamination or return sooner if condition worsens or does not begin to improve over the next few days.   NECK PAIN RED FLAGS: If symptoms get worse than they are right now, you should come back sooner for re-evaluation. If you have increased numbness/ tingling or notice that the numbness/tingling is affecting the legs or saddle region, go to ER. If you ever lose continence go to ER.      BP is high.  This could be related to pain but you want to keep an eye on this.   If your blood pressures consistently over 140/90 you may need medication.  Follow-up with PCP regarding this.     ED Prescriptions     Medication Sig Dispense Auth. Provider   baclofen (LIORESAL) 10 MG tablet Take 1 tablet (10 mg total) by mouth 3 (three) times daily as needed for muscle spasms. 20 each Gareth Morgan      PDMP not reviewed this encounter.   Shirlee Latch, PA-C 12/07/23 1652

## 2023-12-07 NOTE — Discharge Instructions (Addendum)
NECK PAIN: Stressed avoiding painful activities. This can exacerbate your symptoms and make them worse.  May apply heat to the areas of pain for some relief. Use medications as directed. Be aware of which medications make you drowsy and do not drive or operate any kind of heavy machinery while using the medication (ie pain medications or muscle relaxers). F/U with PCP for reexamination or return sooner if condition worsens or does not begin to improve over the next few days.   NECK PAIN RED FLAGS: If symptoms get worse than they are right now, you should come back sooner for re-evaluation. If you have increased numbness/ tingling or notice that the numbness/tingling is affecting the legs or saddle region, go to ER. If you ever lose continence go to ER.      BP is high.  This could be related to pain but you want to keep an eye on this.  If your blood pressures consistently over 140/90 you may need medication.  Follow-up with PCP regarding this.

## 2023-12-07 NOTE — ED Triage Notes (Signed)
Pt is with her boyfriend  Pt c/o knot on the back of the neck x2days  Pt states that there is one on each side.  Pt states that she can not turn her neck to the left and the bumps hurt and stop her from moving her neck.   Pt has taken ibuprofen 800mg  for the pain.
